# Patient Record
Sex: Male | Born: 1945 | Race: White | Hispanic: No | Marital: Married | Smoking: Never smoker
Health system: Southern US, Community
[De-identification: ages and names within clinical notes are randomized; demographics above are authoritative.]

## PROBLEM LIST (undated history)

## (undated) DIAGNOSIS — Q600 Renal agenesis, unilateral: Secondary | ICD-10-CM

## (undated) DIAGNOSIS — E785 Hyperlipidemia, unspecified: Secondary | ICD-10-CM

## (undated) DIAGNOSIS — J45909 Unspecified asthma, uncomplicated: Secondary | ICD-10-CM

## (undated) DIAGNOSIS — K140 Glossitis: Secondary | ICD-10-CM

## (undated) DIAGNOSIS — N433 Hydrocele, unspecified: Secondary | ICD-10-CM

## (undated) DIAGNOSIS — N189 Chronic kidney disease, unspecified: Secondary | ICD-10-CM

## (undated) DIAGNOSIS — M1990 Unspecified osteoarthritis, unspecified site: Secondary | ICD-10-CM

## (undated) DIAGNOSIS — M109 Gout, unspecified: Secondary | ICD-10-CM

## (undated) DIAGNOSIS — N289 Disorder of kidney and ureter, unspecified: Secondary | ICD-10-CM

## (undated) HISTORY — PX: OTHER SURGICAL HISTORY: SHX169

## (undated) HISTORY — PX: CHOLECYSTECTOMY: SHX55

---

## 2005-02-23 ENCOUNTER — Emergency Department: Payer: Self-pay | Admitting: Emergency Medicine

## 2012-09-22 DIAGNOSIS — N401 Enlarged prostate with lower urinary tract symptoms: Secondary | ICD-10-CM | POA: Insufficient documentation

## 2012-09-22 DIAGNOSIS — N430 Encysted hydrocele: Secondary | ICD-10-CM | POA: Insufficient documentation

## 2012-09-22 DIAGNOSIS — N400 Enlarged prostate without lower urinary tract symptoms: Secondary | ICD-10-CM | POA: Insufficient documentation

## 2013-07-05 ENCOUNTER — Ambulatory Visit: Payer: Self-pay | Admitting: Otolaryngology

## 2013-11-05 DIAGNOSIS — M109 Gout, unspecified: Secondary | ICD-10-CM | POA: Insufficient documentation

## 2013-11-20 DIAGNOSIS — E785 Hyperlipidemia, unspecified: Secondary | ICD-10-CM | POA: Insufficient documentation

## 2013-11-20 DIAGNOSIS — N1831 Chronic kidney disease, stage 3a: Secondary | ICD-10-CM | POA: Insufficient documentation

## 2013-11-20 DIAGNOSIS — Q600 Renal agenesis, unilateral: Secondary | ICD-10-CM | POA: Insufficient documentation

## 2013-11-20 DIAGNOSIS — S3022XA Contusion of scrotum and testes, initial encounter: Secondary | ICD-10-CM | POA: Insufficient documentation

## 2013-11-20 DIAGNOSIS — N50819 Testicular pain, unspecified: Secondary | ICD-10-CM | POA: Insufficient documentation

## 2013-11-20 DIAGNOSIS — N433 Hydrocele, unspecified: Secondary | ICD-10-CM | POA: Insufficient documentation

## 2013-11-20 DIAGNOSIS — K140 Glossitis: Secondary | ICD-10-CM | POA: Insufficient documentation

## 2015-03-03 ENCOUNTER — Other Ambulatory Visit: Payer: Self-pay | Admitting: Family Medicine

## 2015-03-03 DIAGNOSIS — R911 Solitary pulmonary nodule: Secondary | ICD-10-CM

## 2015-03-07 ENCOUNTER — Ambulatory Visit
Admission: RE | Admit: 2015-03-07 | Discharge: 2015-03-07 | Disposition: A | Discharge status: Home / Self Care | Payer: BLUE CROSS/BLUE SHIELD | Source: Ambulatory Visit | Attending: Family Medicine | Admitting: Family Medicine

## 2015-03-07 DIAGNOSIS — R911 Solitary pulmonary nodule: Secondary | ICD-10-CM | POA: Diagnosis present

## 2017-04-28 IMAGING — CT CT CHEST W/O CM
2 of 3 series · 15 of 36 positions shown, 18 images · non-contrast
Comparison: Chest x-ray ([REDACTED]) 07/01/2014, 02/26/2015

CLINICAL DATA: 69-year-old male with a history of bronchitis.
History of pulmonary nodules.

EXAM:
CT CHEST WITHOUT CONTRAST
TECHNIQUE: Multidetector CT imaging of the chest was performed following the
standard protocol without IV contrast.

[Series 2: soft tissue · axial · 0.74mm/px · z∈[-491,-181]mm · 12 of 74 slices shown, 15 images]
[im 6/74  mediastinal]
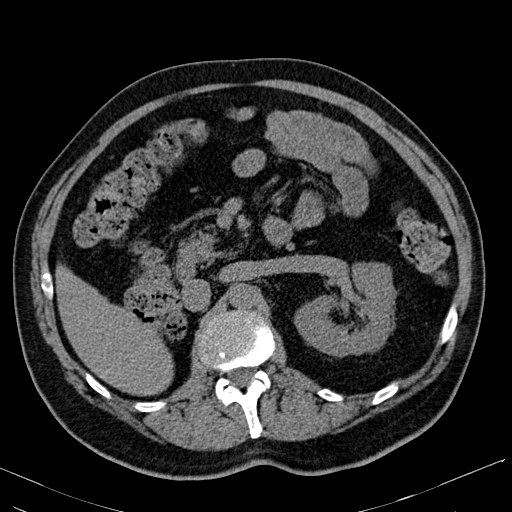
[im 6/74  lung]
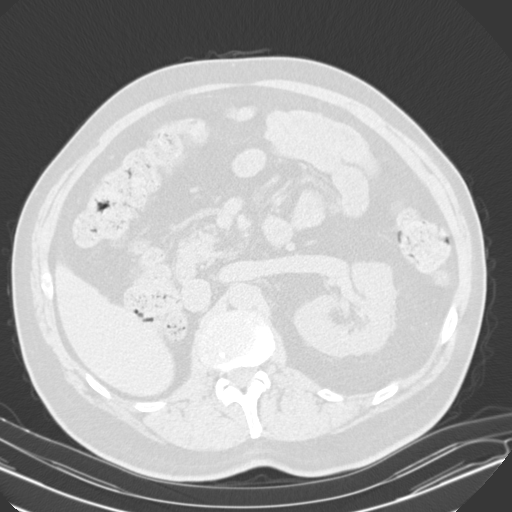
[im 11/74  lung]
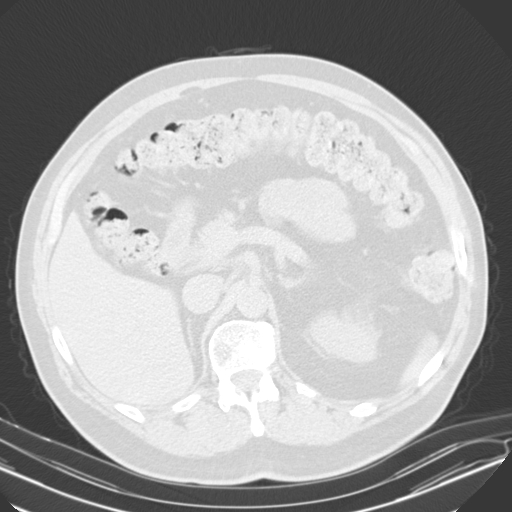
[im 17/74  lung]
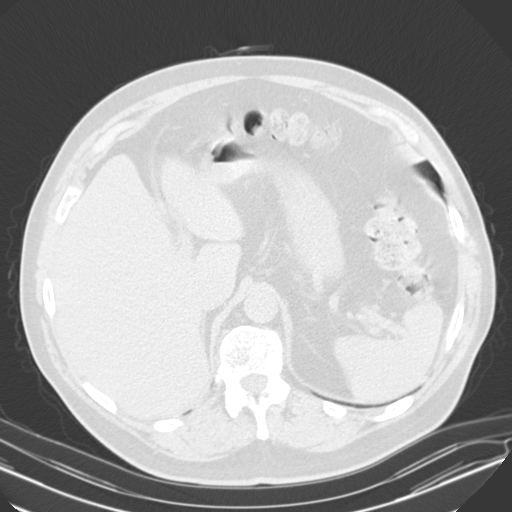
[im 22/74  lung]
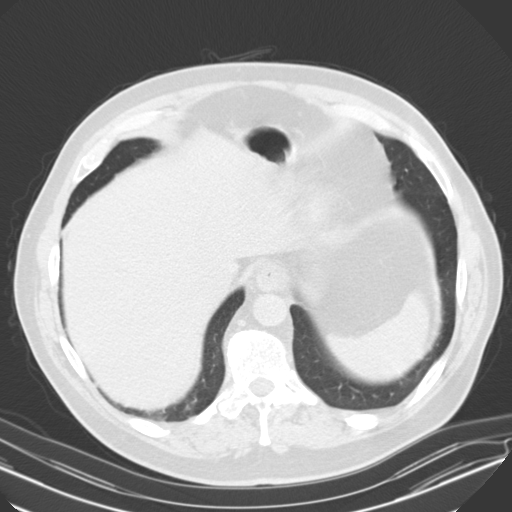
[im 28/74  mediastinal]
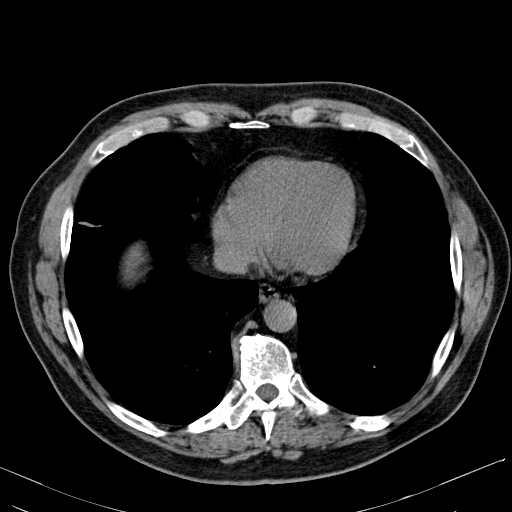
[im 28/74  lung]
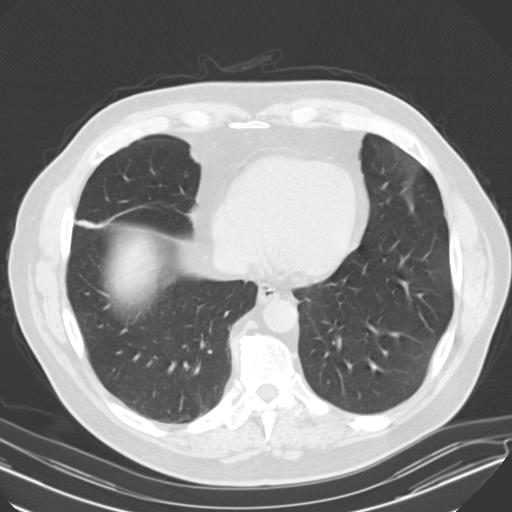
[im 33/74  lung]
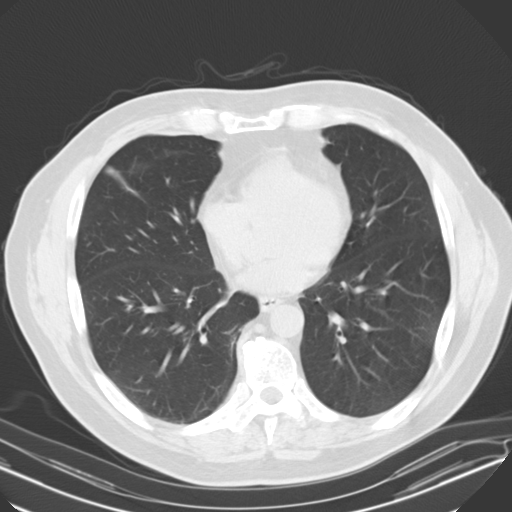
[im 41/74  lung]
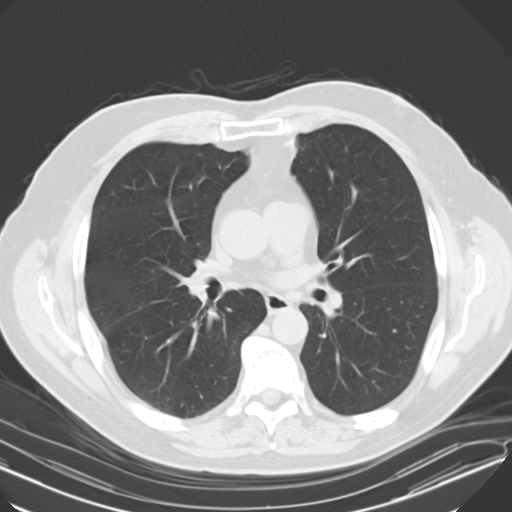
[im 46/74  lung]
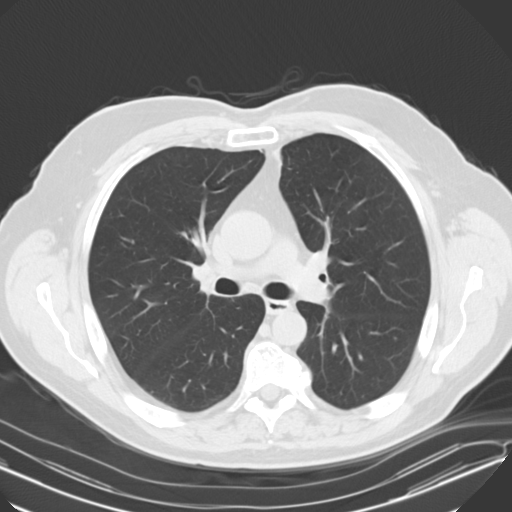
[im 52/74  mediastinal]
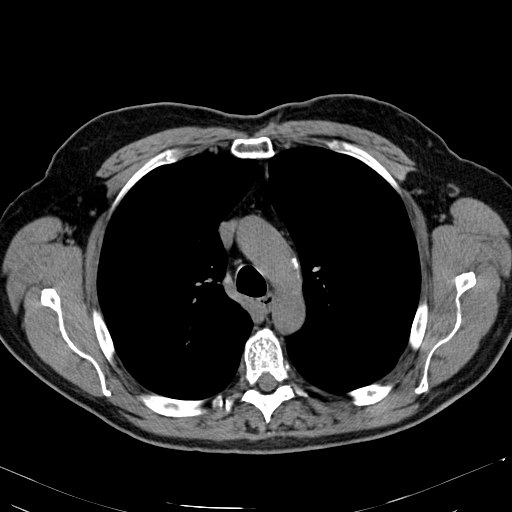
[im 52/74  lung]
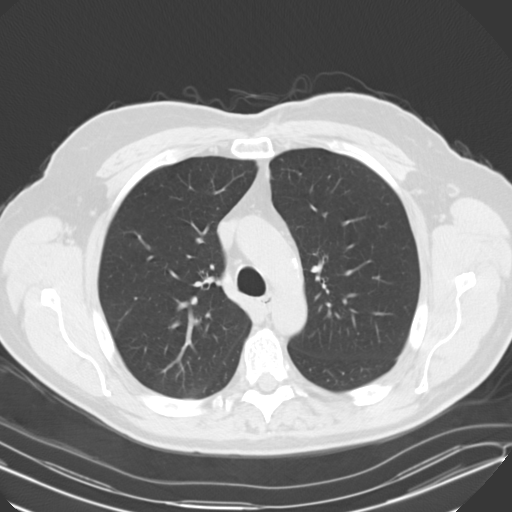
[im 57/74  lung]
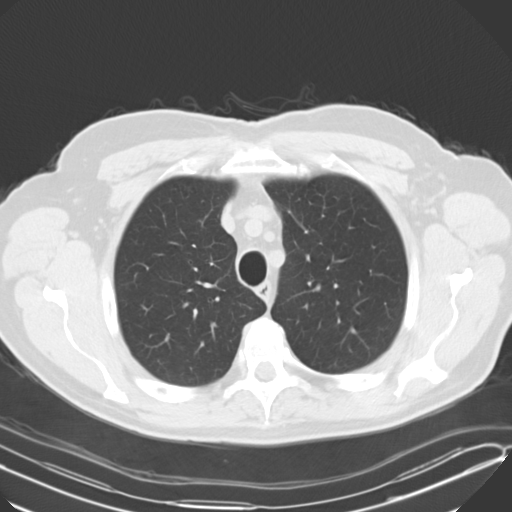
[im 63/74  lung]
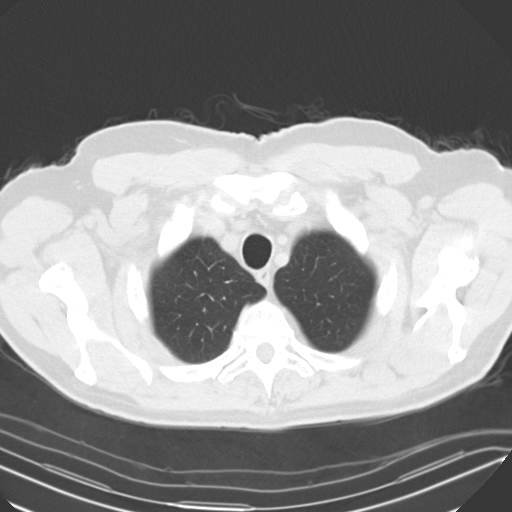
[im 68/74  lung]
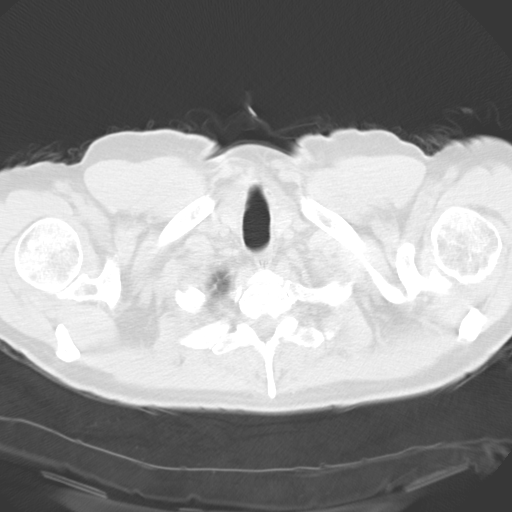

[Series 602: coronal · coronal · 0.74mm/px · 3 of 101 slices shown]
[im 21/101  lung]
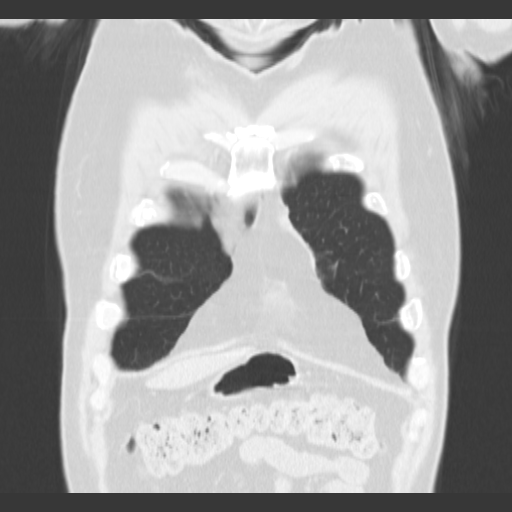
[im 41/101  lung]
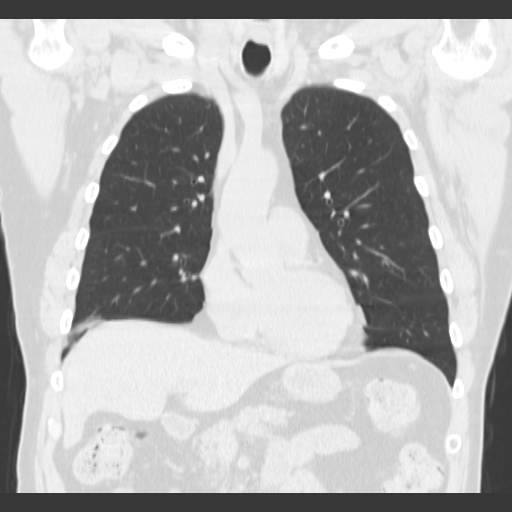
[im 61/101  lung]
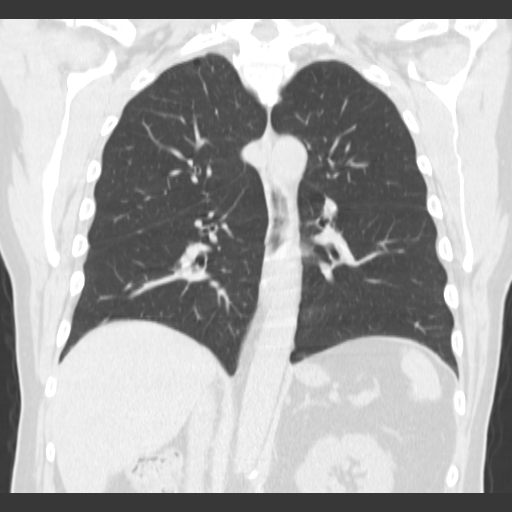

[15 of 36 positions shown; findings below may reference images not displayed]

FINDINGS: Chest:

Unremarkable appearance of the chest superficial soft tissues.

No axillary or supraclavicular adenopathy.

Unremarkable appearance of the thoracic inlet, including the
visualized thyroid.

Several mediastinal lymph nodes, none of which are enlarged by CT
size criteria or have suspicious features.

Unremarkable appearance of the esophagus.

Scattered calcifications of the thoracic aorta. No aneurysm. No
periaortic fluid.

Heart size within normal limits without pericardial fluid/
thickening.

No confluent airspace disease.

No pneumothorax or pleural effusion.

Linear opacity associated with the lingula corresponds to findings
on prior chest x-ray studies.

4 mm nodule of the right lower lobe, lateral segment.

Upper abdomen:

Surgical changes of cholecystectomy.

Musculoskeletal:

No displaced fracture identified.

Multilevel degenerative changes of the thoracic spine with anterior
osteophyte formation and disc space narrowing. No bony canal
narrowing.
IMPRESSION: No acute findings.

Findings on prior chest x-ray correspond to scarring of the lingula.

There is a 4 mm nodule of the right lower lobe. If the patient is at
high risk for bronchogenic carcinoma, follow-up chest CT at 1 year
is recommended. If the patient is at low risk, no follow-up is
needed. This recommendation follows the consensus statement:
Guidelines for Management of Small Pulmonary Nodules Detected on CT
Scans: A Statement from the [HOSPITAL] as published in

## 2018-01-22 DIAGNOSIS — J4530 Mild persistent asthma, uncomplicated: Secondary | ICD-10-CM | POA: Insufficient documentation

## 2018-01-23 DIAGNOSIS — R809 Proteinuria, unspecified: Secondary | ICD-10-CM | POA: Insufficient documentation

## 2018-01-23 DIAGNOSIS — E559 Vitamin D deficiency, unspecified: Secondary | ICD-10-CM | POA: Insufficient documentation

## 2018-04-28 ENCOUNTER — Encounter: Payer: Self-pay | Admitting: *Deleted

## 2018-05-01 ENCOUNTER — Ambulatory Visit
Admission: RE | Admit: 2018-05-01 | Discharge: 2018-05-01 | Disposition: A | Discharge status: Home / Self Care | Payer: BLUE CROSS/BLUE SHIELD | Attending: Gastroenterology | Admitting: Gastroenterology

## 2018-05-01 ENCOUNTER — Ambulatory Visit: Payer: BLUE CROSS/BLUE SHIELD | Admitting: Certified Registered"

## 2018-05-01 ENCOUNTER — Other Ambulatory Visit: Payer: Self-pay

## 2018-05-01 ENCOUNTER — Encounter
Admission: RE | Disposition: A | Discharge status: Home / Self Care | Payer: Self-pay | Source: Home / Self Care | Attending: Gastroenterology

## 2018-05-01 ENCOUNTER — Encounter: Payer: Self-pay | Admitting: Student

## 2018-05-01 DIAGNOSIS — Q600 Renal agenesis, unilateral: Secondary | ICD-10-CM | POA: Diagnosis not present

## 2018-05-01 DIAGNOSIS — D122 Benign neoplasm of ascending colon: Secondary | ICD-10-CM | POA: Diagnosis not present

## 2018-05-01 DIAGNOSIS — Z7951 Long term (current) use of inhaled steroids: Secondary | ICD-10-CM | POA: Diagnosis not present

## 2018-05-01 DIAGNOSIS — M1990 Unspecified osteoarthritis, unspecified site: Secondary | ICD-10-CM | POA: Insufficient documentation

## 2018-05-01 DIAGNOSIS — N189 Chronic kidney disease, unspecified: Secondary | ICD-10-CM | POA: Diagnosis not present

## 2018-05-01 DIAGNOSIS — D126 Benign neoplasm of colon, unspecified: Secondary | ICD-10-CM | POA: Insufficient documentation

## 2018-05-01 DIAGNOSIS — J45909 Unspecified asthma, uncomplicated: Secondary | ICD-10-CM | POA: Insufficient documentation

## 2018-05-01 DIAGNOSIS — Z9049 Acquired absence of other specified parts of digestive tract: Secondary | ICD-10-CM | POA: Insufficient documentation

## 2018-05-01 DIAGNOSIS — Z860101 Personal history of adenomatous and serrated colon polyps: Secondary | ICD-10-CM | POA: Insufficient documentation

## 2018-05-01 DIAGNOSIS — Z79899 Other long term (current) drug therapy: Secondary | ICD-10-CM | POA: Diagnosis not present

## 2018-05-01 DIAGNOSIS — K621 Rectal polyp: Secondary | ICD-10-CM | POA: Insufficient documentation

## 2018-05-01 DIAGNOSIS — E785 Hyperlipidemia, unspecified: Secondary | ICD-10-CM | POA: Insufficient documentation

## 2018-05-01 DIAGNOSIS — D123 Benign neoplasm of transverse colon: Secondary | ICD-10-CM | POA: Diagnosis not present

## 2018-05-01 DIAGNOSIS — M109 Gout, unspecified: Secondary | ICD-10-CM | POA: Diagnosis not present

## 2018-05-01 DIAGNOSIS — Z1211 Encounter for screening for malignant neoplasm of colon: Secondary | ICD-10-CM | POA: Insufficient documentation

## 2018-05-01 DIAGNOSIS — K5730 Diverticulosis of large intestine without perforation or abscess without bleeding: Secondary | ICD-10-CM | POA: Diagnosis not present

## 2018-05-01 HISTORY — DX: Hydrocele, unspecified: N43.3

## 2018-05-01 HISTORY — PX: COLONOSCOPY WITH PROPOFOL: SHX5780

## 2018-05-01 HISTORY — DX: Unspecified asthma, uncomplicated: J45.909

## 2018-05-01 HISTORY — DX: Renal agenesis, unilateral: Q60.0

## 2018-05-01 HISTORY — DX: Unspecified osteoarthritis, unspecified site: M19.90

## 2018-05-01 HISTORY — DX: Disorder of kidney and ureter, unspecified: N28.9

## 2018-05-01 HISTORY — DX: Hyperlipidemia, unspecified: E78.5

## 2018-05-01 HISTORY — DX: Gout, unspecified: M10.9

## 2018-05-01 HISTORY — DX: Glossitis: K14.0

## 2018-05-01 HISTORY — DX: Chronic kidney disease, unspecified: N18.9

## 2018-05-01 SURGERY — COLONOSCOPY WITH PROPOFOL
Anesthesia: General

## 2018-05-01 MED ORDER — PHENYLEPHRINE HCL 10 MG/ML IJ SOLN
INTRAMUSCULAR | Status: DC | PRN
  Administered 2018-05-01: 100 ug via INTRAVENOUS
  Administered 2018-05-01: 150 ug via INTRAVENOUS
  Administered 2018-05-01 (×3): 100 ug via INTRAVENOUS

## 2018-05-01 MED ORDER — LIDOCAINE HCL (PF) 2 % IJ SOLN
INTRAMUSCULAR | Status: AC
  Filled 2018-05-01: qty 10

## 2018-05-01 MED ORDER — SODIUM CHLORIDE 0.9 % IV SOLN
INTRAVENOUS | Status: DC
  Administered 2018-05-01: 14:00:00 via INTRAVENOUS

## 2018-05-01 MED ORDER — PROPOFOL 10 MG/ML IV BOLUS
INTRAVENOUS | Status: DC | PRN
  Administered 2018-05-01: 20 mg via INTRAVENOUS
  Administered 2018-05-01: 60 mg via INTRAVENOUS
  Administered 2018-05-01: 20 mg via INTRAVENOUS

## 2018-05-01 MED ORDER — PROPOFOL 500 MG/50ML IV EMUL
INTRAVENOUS | Status: AC
  Filled 2018-05-01: qty 50

## 2018-05-01 MED ORDER — GLYCOPYRROLATE 0.2 MG/ML IJ SOLN
INTRAMUSCULAR | Status: AC
  Filled 2018-05-01: qty 1

## 2018-05-01 MED ORDER — LIDOCAINE HCL (CARDIAC) PF 100 MG/5ML IV SOSY
PREFILLED_SYRINGE | INTRAVENOUS | Status: DC | PRN
  Administered 2018-05-01: 60 mg via INTRATRACHEAL

## 2018-05-01 MED ORDER — PROPOFOL 500 MG/50ML IV EMUL
INTRAVENOUS | Status: DC | PRN
  Administered 2018-05-01: 100 ug/kg/min via INTRAVENOUS

## 2018-05-01 MED ORDER — GLYCOPYRROLATE 0.2 MG/ML IJ SOLN
INTRAMUSCULAR | Status: DC | PRN
  Administered 2018-05-01 (×2): 0.1 mg via INTRAVENOUS

## 2018-05-01 NOTE — Anesthesia Post-op Follow-up Note (Signed)
Anesthesia QCDR form completed.        

## 2018-05-01 NOTE — H&P (Signed)
Outpatient short stay form Pre-procedure 05/01/2018 2:17 PM Lollie Sails MD  Primary Physician: Dr. Genene Churn  Reason for visit: Colonoscopy  History of present illness: Patient is a 73 year old male presenting today for a colonoscopy.  This is his first colonoscopy.  Colon cancer screening purposes.  He tolerated his prep well.  He takes no aspirin or blood thinning agent.  No problems with rectal bleeding abdominal pain or diarrhea.    Current Facility-Administered Medications:  .  0.9 %  sodium chloride infusion, , Intravenous, Continuous, Lollie Sails, MD, Last Rate: 20 mL/hr at 05/01/18 1353  Medications Prior to Admission  Medication Sig Dispense Refill Last Dose  . albuterol (PROVENTIL HFA;VENTOLIN HFA) 108 (90 Base) MCG/ACT inhaler Inhale 2 puffs into the lungs every 4 (four) hours as needed for wheezing or shortness of breath.   Past Month at Unknown time  . fluticasone (FLONASE) 50 MCG/ACT nasal spray Place 2 sprays into both nostrils daily.     . Fluticasone-Salmeterol (ADVAIR) 100-50 MCG/DOSE AEPB Inhale 1 puff into the lungs 2 (two) times daily. Rinse mouth afterward   Past Week at Unknown time  . diphenhydramine-acetaminophen (TYLENOL PM) 25-500 MG TABS tablet Take 2 tablets by mouth at bedtime as needed.   Not Taking at Unknown time  . loratadine (CLARITIN) 10 MG tablet Take 10 mg by mouth daily.   Not Taking at Unknown time     No Known Allergies   Past Medical History:  Diagnosis Date  . Arthritis   . Asthma    asthmatic bronchitis  . Chronic renal insufficiency   . Congenital single kidney   . Glossitis   . Gout   . Hydrocele   . Hyperlipidemia     Review of systems:      Physical Exam    Heart and lungs: Regular rate and rhythm without rub or gallop, lungs are bilaterally clear.    HEENT: Normocephalic atraumatic eyes are anicteric    Other:    Pertinant exam for procedure: Soft nontender nondistended bowel sounds positive  normoactive    Planned proceedures: Colonoscopy and indicated procedures. I have discussed the risks benefits and complications of procedures to include not limited to bleeding, infection, perforation and the risk of sedation and the patient wishes to proceed.    Lollie Sails, MD Gastroenterology 05/01/2018  2:17 PM

## 2018-05-01 NOTE — Op Note (Addendum)
[facility] Gastroenterology Patient Name: Kyle Lawrence Procedure Date: 05/01/2018 2:08 PM MRN: [id number] Account #: [id number] Date of Birth: 06/21/1945 Admit Type: Outpatient Age: 73 Room: [facility] ENDO ROOM 1 Gender: Male Note Status: Finalized Procedure:            Colonoscopy Indications:          Screening for colorectal malignant neoplasm, This is                        the patient's first colonoscopy Providers:            Lollie Sails, MD Medicines:            Monitored Anesthesia Care Complications:        No immediate complications. Procedure:            Pre-Anesthesia Assessment:                       - ASA Grade Assessment: II - A patient with mild                        systemic disease.                       After obtaining informed consent, the colonoscope was                        passed under direct vision. Throughout the procedure,                        the patient's blood pressure, pulse, and oxygen                        saturations were monitored continuously. The                        Colonoscope was introduced through the anus and                        advanced to the the cecum, identified by appendiceal                        orifice and ileocecal valve. The colonoscopy was                        performed with moderate difficulty. Successful                        completion of the procedure was aided by changing the                        patient to a supine position and changing the patient                        to a prone position. The patient tolerated the                        procedure well. The quality of the bowel preparation                        was fair. Findings:  A 2 mm polyp was found in the transverse colon. The polyp was sessile.       The polyp was removed with a cold biopsy forceps. Resection and       retrieval were complete.      A 4 mm polyp was found in the proximal ascending colon. The polyp was    sessile. The polyp was removed with a piecemeal technique using a cold       biopsy forceps. Resection and retrieval were complete.      Three sessile polyps were found in the rectum. The polyps were less than       1 mm in size. These polyps were removed with a cold biopsy forceps.       Resection and retrieval were complete.      Multiple small-mouthed diverticula were found in the sigmoid colon,       descending colon and transverse colon.      The digital rectal exam was normal. Impression:           - Preparation of the colon was fair.                       - One 2 mm polyp in the transverse colon, removed with                        a cold biopsy forceps. Resected and retrieved.                       - One 4 mm polyp in the proximal ascending colon,                        removed piecemeal using a cold biopsy forceps. Resected                        and retrieved.                       - Three less than 1 mm polyps in the rectum, removed                        with a cold biopsy forceps. Resected and retrieved.                       - Diverticulosis in the sigmoid colon, in the                        descending colon and in the transverse colon. Recommendation:       - Discharge patient to home.                       - Telephone GI clinic for pathology results in 1 week. Procedure Code(s):    --- Professional ---                       [phone], Colonoscopy, flexible; with biopsy, single or                        multiple Diagnosis Code(s):    --- Professional ---                       Z12.11,  Encounter for screening for malignant neoplasm                        of colon                       D12.3, Benign neoplasm of transverse colon (hepatic                        flexure or splenic flexure)                       D12.2, Benign neoplasm of ascending colon                       K62.1, Rectal polyp                       K57.30, Diverticulosis of large intestine without                         perforation or abscess without bleeding CPT copyright 2018 American Medical Association. All rights reserved. The codes documented in this report are preliminary and upon coder review may  be revised to meet current compliance requirements. Lollie Sails, MD 05/01/2018 2:59:55 PM This report has been signed electronically. Number of Addenda: 0 Note Initiated On: 05/01/2018 2:08 PM Total Procedure Duration: 0 hours 27 minutes 3 seconds       [facility]

## 2018-05-01 NOTE — Anesthesia Preprocedure Evaluation (Signed)
Anesthesia Evaluation  Patient identified by MRN, date of birth, ID band Patient awake    Reviewed: Allergy & Precautions, H&P , NPO status , Patient's Chart, lab work & pertinent test results  History of Anesthesia Complications Negative for: history of anesthetic complications  Airway Mallampati: III  TM Distance: <3 FB Neck ROM: full    Dental  (+) Chipped, Poor Dentition, Missing   Pulmonary asthma ,           Cardiovascular Exercise Tolerance: Good (-) angina(-) Past MI and (-) DOE negative cardio ROS       Neuro/Psych negative neurological ROS  negative psych ROS   GI/Hepatic negative GI ROS, Neg liver ROS,   Endo/Other  negative endocrine ROS  Renal/GU Renal disease  negative genitourinary   Musculoskeletal  (+) Arthritis ,   Abdominal   Peds  Hematology negative hematology ROS (+)   Anesthesia Other Findings Past Medical History: No date: Arthritis No date: Asthma     Comment:  asthmatic bronchitis No date: Chronic renal insufficiency No date: Congenital single kidney No date: Glossitis No date: Gout No date: Hydrocele No date: Hyperlipidemia  Past Surgical History: No date: CHOLECYSTECTOMY No date: deviated septum repair  BMI    Body Mass Index:  28.41 kg/m      Reproductive/Obstetrics negative OB ROS                             Anesthesia Physical Anesthesia Plan  ASA: III  Anesthesia Plan: General   Post-op Pain Management:    Induction: Intravenous  PONV Risk Score and Plan: Propofol infusion and TIVA  Airway Management Planned: Natural Airway and Nasal Cannula  Additional Equipment:   Intra-op Plan:   Post-operative Plan:   Informed Consent: I have reviewed the patients History and Physical, chart, labs and discussed the procedure including the risks, benefits and alternatives for the proposed anesthesia with the patient or authorized  representative who has indicated his/her understanding and acceptance.     Dental Advisory Given  Plan Discussed with: Anesthesiologist, CRNA and Surgeon  Anesthesia Plan Comments: (Patient consented for risks of anesthesia including but not limited to:  - adverse reactions to medications - risk of intubation if required - damage to teeth, lips or other oral mucosa - sore throat or hoarseness - Damage to heart, brain, lungs or loss of life  Patient voiced understanding.)        Anesthesia Quick Evaluation

## 2018-05-01 NOTE — Transfer of Care (Signed)
Immediate Anesthesia Transfer of Care Note  Patient: Kyle Lawrence  Procedure(s) Performed: COLONOSCOPY WITH PROPOFOL (N/A )  Patient Location: Endoscopy Unit  Anesthesia Type:General  Level of Consciousness: drowsy  Airway & Oxygen Therapy: Patient Spontanous Breathing and Patient connected to nasal cannula oxygen  Post-op Assessment: Report given to RN and Post -op Vital signs reviewed and stable  Post vital signs: stable  Last Vitals:  Vitals Value Taken Time  BP 114/69 05/01/2018  3:04 PM  Temp 36.1 C 05/01/2018  3:04 PM  Pulse 67 05/01/2018  3:06 PM  Resp 10 05/01/2018  3:06 PM  SpO2 98 % 05/01/2018  3:06 PM  Vitals shown include unvalidated device data.  Last Pain:  Vitals:   05/01/18 1504  TempSrc: Tympanic  PainSc: 0-No pain         Complications: No apparent anesthesia complications

## 2018-05-02 ENCOUNTER — Encounter: Payer: Self-pay | Admitting: Gastroenterology

## 2018-05-03 LAB — SURGICAL PATHOLOGY

## 2018-05-03 NOTE — Anesthesia Postprocedure Evaluation (Signed)
Anesthesia Post Note  Patient: Kyle Lawrence  Procedure(s) Performed: COLONOSCOPY WITH PROPOFOL (N/A )  Patient location during evaluation: Endoscopy Anesthesia Type: General Level of consciousness: awake and alert Pain management: pain level controlled Vital Signs Assessment: post-procedure vital signs reviewed and stable Respiratory status: spontaneous breathing, nonlabored ventilation and respiratory function stable Cardiovascular status: blood pressure returned to baseline and stable Postop Assessment: no apparent nausea or vomiting Anesthetic complications: no     Last Vitals:  Vitals:   05/01/18 1524 05/01/18 1534  BP: 120/86 129/88  Pulse: 67 68  Resp: 15 16  Temp:    SpO2: 96% 97%    Last Pain:  Vitals:   05/01/18 1534  TempSrc:   PainSc: 0-No pain                 Alphonsus Sias

## 2022-07-26 NOTE — Telephone Encounter (Signed)
Please review.  KP

## 2022-07-27 NOTE — Telephone Encounter (Signed)
Pt response.  KP

## 2022-07-29 ENCOUNTER — Encounter: Payer: Self-pay | Admitting: Physician Assistant

## 2022-08-03 ENCOUNTER — Ambulatory Visit (INDEPENDENT_AMBULATORY_CARE_PROVIDER_SITE_OTHER): Payer: Medicare Other | Admitting: Physician Assistant

## 2022-08-03 ENCOUNTER — Encounter: Payer: Self-pay | Admitting: Physician Assistant

## 2022-08-03 VITALS — BP 120/78 | HR 76 | Temp 98.1°F | Ht 70.0 in | Wt 204.0 lb

## 2022-08-03 DIAGNOSIS — E559 Vitamin D deficiency, unspecified: Secondary | ICD-10-CM

## 2022-08-03 DIAGNOSIS — N401 Enlarged prostate with lower urinary tract symptoms: Secondary | ICD-10-CM | POA: Diagnosis not present

## 2022-08-03 DIAGNOSIS — N1831 Chronic kidney disease, stage 3a: Secondary | ICD-10-CM | POA: Diagnosis not present

## 2022-08-03 DIAGNOSIS — E790 Hyperuricemia without signs of inflammatory arthritis and tophaceous disease: Secondary | ICD-10-CM | POA: Diagnosis not present

## 2022-08-03 DIAGNOSIS — Q600 Renal agenesis, unilateral: Secondary | ICD-10-CM | POA: Diagnosis not present

## 2022-08-03 DIAGNOSIS — R300 Dysuria: Secondary | ICD-10-CM

## 2022-08-03 DIAGNOSIS — E7800 Pure hypercholesterolemia, unspecified: Secondary | ICD-10-CM

## 2022-08-03 LAB — POCT URINALYSIS DIPSTICK
Bilirubin, UA: NEGATIVE
Blood, UA: NEGATIVE
Glucose, UA: NEGATIVE
Ketones, UA: NEGATIVE
Leukocytes, UA: NEGATIVE
Nitrite, UA: NEGATIVE
Protein, UA: NEGATIVE
Spec Grav, UA: 1.015 (ref 1.010–1.025)
Urobilinogen, UA: 0.2 E.U./dL
pH, UA: 6 (ref 5.0–8.0)

## 2022-08-03 NOTE — Patient Instructions (Addendum)

## 2022-08-03 NOTE — Progress Notes (Signed)
Date:  08/03/2022   Name:  Kyle Lawrence   DOB:  Jul 20, 1945   MRN:  [id number]   Chief Complaint: New Patient (Initial Visit) and Flank Pain (Pain and pressure on left flank. Patient only has one kidney. Burning while urinating. Constant pressure. This all started about a month ago and he seen blood in it. But no blood present now. )  HPI Kyle Lawrence is a very pleasant 77 year old male with a history of horseshoe kidney, CKD G3aA2, BPH with LUTS, and gout presents new to our clinic today to establish care and discuss urinary concern describing dysuria with associated left flank discomfort which is mild but persistent, described as "pressure, slight tenderness".  Initially he saw blood in the urine but this resolved spontaneously without recurrence.  No penile discharge.  Married, wife's name is Misty Stanley. Otherwise feels well.   He has been lost to routine care for 3+ years, he was unexpectedly discharged from [facility] after missing a few appointments and simply did not find a new PCP until now.  He does not see a nephrologist or urologist.   Medication list has been reviewed and updated.  Current Meds  Medication Sig   albuterol (PROVENTIL HFA;VENTOLIN HFA) 108 (90 Base) MCG/ACT inhaler Inhale 2 puffs into the lungs every 4 (four) hours as needed for wheezing or shortness of breath.   calcium-vitamin D (OSCAL WITH D) 500-5 MG-MCG tablet Take 1 tablet by mouth.   [DISCONTINUED] loratadine (CLARITIN) 10 MG tablet Take 10 mg by mouth daily.     Review of Systems  Constitutional:  Negative for fatigue and fever.  Respiratory:  Negative for chest tightness and shortness of breath.   Cardiovascular:  Negative for chest pain and palpitations.  Gastrointestinal:  Negative for abdominal pain.  Genitourinary:  Positive for dysuria, flank pain and hematuria. Negative for frequency, penile discharge, penile swelling, testicular pain and urgency.    Patient Active Problem List    Diagnosis Date Noted   Tubular adenoma of colon 05/01/2018   Vitamin D deficiency 01/23/2018   Asymptomatic proteinuria 01/23/2018   Asthmatic bronchitis, mild persistent, uncomplicated 01/22/2018   Scrotal hematoma 11/20/2013   Renal agenesis, unilateral 11/20/2013   Orchalgia 11/20/2013   Hyperlipidemia 11/20/2013   Hydrocele 11/20/2013   Glossitis 11/20/2013   Chronic kidney disease (CKD) stage G3a/A2, moderately decreased glomerular filtration rate (GFR) between 45-59 mL/min/1.73 square meter and albuminuria creatinine ratio between 30-299 mg/g (HCC) 11/20/2013   Gout 11/05/2013   Encysted hydrocele 09/22/2012   Benign prostatic hyperplasia without lower urinary tract symptoms 09/22/2012   Benign prostatic hyperplasia with lower urinary tract symptoms 09/22/2012    Allergies  Allergen Reactions   Colchicine Diarrhea and Nausea Only    Immunization History  Administered Date(s) Administered   Influenza Inj Mdck Quad Pf 01/23/2018   Pneumococcal Polysaccharide-23 10/17/2018    Past Surgical History:  Procedure Laterality Date   CHOLECYSTECTOMY     COLONOSCOPY WITH PROPOFOL N/A 05/01/2018   Procedure: COLONOSCOPY WITH PROPOFOL;  Surgeon: Christena Deem, MD;  Location: [facility] ENDOSCOPY;  Service: Endoscopy;  Laterality: N/A;   deviated septum repair      Social History   Tobacco Use   Smoking status: Never   Smokeless tobacco: Never  Vaping Use   Vaping Use: Never used  Substance Use Topics   Alcohol use: Yes    Comment: occasional wine   Drug use: Never    Family History  Problem Relation Age of Onset  Diabetes Mother    Kidney disease Father    Diabetes Maternal Uncle         08/03/2022    8:52 AM  GAD 7 : Generalized Anxiety Score  Nervous, Anxious, on Edge 0  Control/stop worrying 0  Worry too much - different things 0  Trouble relaxing 0  Restless 0  Easily annoyed or irritable 0  Afraid - awful might happen 0  Total GAD 7 Score 0  Anxiety  Difficulty Not difficult at all       08/03/2022    8:52 AM  Depression screen PHQ 2/9  Decreased Interest 0  Down, Depressed, Hopeless 0  PHQ - 2 Score 0  Altered sleeping 0  Tired, decreased energy 0  Change in appetite 0  Feeling bad or failure about yourself  0  Trouble concentrating 0  Moving slowly or fidgety/restless 0  Suicidal thoughts 0  PHQ-9 Score 0  Difficult doing work/chores Not difficult at all    BP Readings from Last 3 Encounters:  08/03/22 120/78  05/01/18 129/88    Wt Readings from Last 3 Encounters:  08/03/22 204 lb (92.5 kg)  05/01/18 198 lb (89.8 kg)    BP 120/78   Pulse 76   Temp 98.1 F (36.7 C) (Oral)   Ht 5\' 10"  (1.778 m)   Wt 204 lb (92.5 kg)   SpO2 96%   BMI 29.27 kg/m   Physical Exam Vitals and nursing note reviewed.  Constitutional:      Appearance: Normal appearance.  Cardiovascular:     Rate and Rhythm: Normal rate and regular rhythm.     Heart sounds: No murmur heard.    No friction rub. No gallop.  Pulmonary:     Effort: Pulmonary effort is normal.     Breath sounds: Normal breath sounds.  Abdominal:     General: Abdomen is flat. Bowel sounds are normal. There is no distension.     Palpations: Abdomen is soft. There is no mass.     Tenderness: There is no abdominal tenderness. There is no right CVA tenderness or left CVA tenderness.     Hernia: No hernia is present.     Comments: Essentially normal abdominal exam with no obvious tenderness, perhaps a subtle discomfort of left upper quadrant/flank but this is minimal even with deep palpation.  Genitourinary:    Comments: Prostate exam deferred today as we are checking PSA, but plan to perform at complete physical in the next few weeks.  Musculoskeletal:        General: Normal range of motion.  Skin:    General: Skin is warm and dry.  Neurological:     Mental Status: He is alert and oriented to person, place, and time.     Gait: Gait is intact.  Psychiatric:         Mood and Affect: Mood and affect normal.     Recent Labs  No results found for: "NA", "K", "CL", "CO2", "GLUCOSE", "BUN", "CREATININE", "CALCIUM", "PROT", "ALBUMIN", "AST", "ALT", "ALKPHOS", "BILITOT", "GFRNONAA", "GFRAA"  No results found for: "WBC", "HGB", "HCT", "MCV", "PLT" No results found for: "HGBA1C" No results found for: "CHOL", "HDL", "LDLCALC", "LDLDIRECT", "TRIG", "CHOLHDL" No results found for: "TSH"   Assessment and Plan:  1. Dysuria Urine dipstick clear today.  At this time most likely cause for dysuria is BPH, consider tamsulosin if today's labs normal. Differential also includes urethral stenosis, nonobstructive kidney stone.  - POCT Urinalysis Dipstick  2. Benign prostatic  hyperplasia with lower urinary tract symptoms, symptom details unspecified Check PSA today.  Plan for prostate exam next visit. - PSA  3. Renal agenesis, unilateral Obtain baseline labs today - CBC with Differential/Platelet - Comprehensive metabolic panel  4. Chronic kidney disease (CKD) stage G3a/A2, moderately decreased glomerular filtration rate (GFR) between 45-59 mL/min/1.73 square meter and albuminuria creatinine ratio between 30-299 mg/g (HCC) Obtain baseline labs today along with micro albumin - CBC with Differential/Platelet - Comprehensive metabolic panel - Microalbumin / creatinine urine ratio  5. Hyperuricemia Check uric acid levels, last known uric acid was 8.8 in 2019, I discussed with patient is out of range.  Info given on best and worst foods for gout. - Uric acid     Close f/u with me in 2-4wk for fasting CPE. Check lipids and Vit D among other tests if necessary.   Partially dictated using Animal nutritionist. Any errors are unintentional.  Alvester Morin, PA-C, DMSc, Nutritionist [facility] [phone]

## 2022-08-04 ENCOUNTER — Other Ambulatory Visit: Payer: Self-pay | Admitting: Physician Assistant

## 2022-08-04 DIAGNOSIS — M1A3720 Chronic gout due to renal impairment, left ankle and foot, without tophus (tophi): Secondary | ICD-10-CM

## 2022-08-04 DIAGNOSIS — N400 Enlarged prostate without lower urinary tract symptoms: Secondary | ICD-10-CM

## 2022-08-04 LAB — CBC WITH DIFFERENTIAL/PLATELET
Basophils Absolute: 0 10*3/uL (ref 0.0–0.2)
Basos: 0 %
EOS (ABSOLUTE): 0.2 10*3/uL (ref 0.0–0.4)
Eos: 2 %
Hematocrit: 47.5 % (ref 37.5–51.0)
Hemoglobin: 15.4 g/dL (ref 13.0–17.7)
Immature Grans (Abs): 0 10*3/uL (ref 0.0–0.1)
Immature Granulocytes: 0 %
Lymphocytes Absolute: 2 10*3/uL (ref 0.7–3.1)
Lymphs: 26 %
MCH: 29.9 pg (ref 26.6–33.0)
MCHC: 32.4 g/dL (ref 31.5–35.7)
MCV: 92 fL (ref 79–97)
Monocytes Absolute: 0.8 10*3/uL (ref 0.1–0.9)
Monocytes: 10 %
Neutrophils Absolute: 4.8 10*3/uL (ref 1.4–7.0)
Neutrophils: 62 %
Platelets: 187 10*3/uL (ref 150–450)
RBC: 5.15 x10E6/uL (ref 4.14–5.80)
RDW: 12.3 % (ref 11.6–15.4)
WBC: 7.8 10*3/uL (ref 3.4–10.8)

## 2022-08-04 LAB — COMPREHENSIVE METABOLIC PANEL
ALT: 24 IU/L (ref 0–44)
AST: 21 IU/L (ref 0–40)
Albumin/Globulin Ratio: 1.7 (ref 1.2–2.2)
Albumin: 4.7 g/dL (ref 3.8–4.8)
Alkaline Phosphatase: 84 IU/L (ref 44–121)
BUN/Creatinine Ratio: 10 (ref 10–24)
BUN: 15 mg/dL (ref 8–27)
Bilirubin Total: 0.7 mg/dL (ref 0.0–1.2)
CO2: 22 mmol/L (ref 20–29)
Calcium: 10.6 mg/dL — ABNORMAL HIGH (ref 8.6–10.2)
Chloride: 103 mmol/L (ref 96–106)
Creatinine, Ser: 1.57 mg/dL — ABNORMAL HIGH (ref 0.76–1.27)
Globulin, Total: 2.7 g/dL (ref 1.5–4.5)
Glucose: 92 mg/dL (ref 70–99)
Potassium: 5.1 mmol/L (ref 3.5–5.2)
Sodium: 139 mmol/L (ref 134–144)
Total Protein: 7.4 g/dL (ref 6.0–8.5)
eGFR: 45 mL/min/{1.73_m2} — ABNORMAL LOW (ref >59)

## 2022-08-04 LAB — URIC ACID: Uric Acid: 8.2 mg/dL (ref 3.8–8.4)

## 2022-08-04 LAB — SPECIMEN STATUS REPORT

## 2022-08-04 LAB — MICROALBUMIN / CREATININE URINE RATIO
Creatinine, Urine: 50.4 mg/dL
Microalb/Creat Ratio: 159 mg/g creat — ABNORMAL HIGH (ref 0–29)
Microalbumin, Urine: 80.1 ug/mL

## 2022-08-04 LAB — PSA: Prostate Specific Ag, Serum: 0.8 ng/mL (ref 0.0–4.0)

## 2022-08-04 MED ORDER — TAMSULOSIN HCL 0.4 MG PO CAPS: 0.4000 mg | ORAL_CAPSULE | Freq: Every day | ORAL | 3 refills | Status: DC

## 2022-08-04 MED ORDER — ALLOPURINOL 100 MG PO TABS: ORAL_TABLET | ORAL | 2 refills | Status: DC

## 2022-08-11 NOTE — Telephone Encounter (Signed)
Please review.  KP

## 2022-08-13 ENCOUNTER — Ambulatory Visit: Payer: Medicare Other | Admitting: Physician Assistant

## 2022-08-18 ENCOUNTER — Ambulatory Visit (INDEPENDENT_AMBULATORY_CARE_PROVIDER_SITE_OTHER): Payer: Medicare Other

## 2022-08-18 ENCOUNTER — Ambulatory Visit (INDEPENDENT_AMBULATORY_CARE_PROVIDER_SITE_OTHER): Payer: Medicare Other | Admitting: Physician Assistant

## 2022-08-18 ENCOUNTER — Encounter: Payer: Self-pay | Admitting: Physician Assistant

## 2022-08-18 VITALS — BP 104/76 | HR 75 | Ht 70.0 in | Wt 202.0 lb

## 2022-08-18 VITALS — BP 104/76 | HR 75 | Temp 98.0°F | Ht 70.0 in | Wt 202.0 lb

## 2022-08-18 DIAGNOSIS — R052 Subacute cough: Secondary | ICD-10-CM | POA: Diagnosis not present

## 2022-08-18 DIAGNOSIS — Z0000 Encounter for general adult medical examination without abnormal findings: Secondary | ICD-10-CM

## 2022-08-18 DIAGNOSIS — N1831 Chronic kidney disease, stage 3a: Secondary | ICD-10-CM | POA: Diagnosis not present

## 2022-08-18 DIAGNOSIS — L821 Other seborrheic keratosis: Secondary | ICD-10-CM

## 2022-08-18 DIAGNOSIS — B351 Tinea unguium: Secondary | ICD-10-CM

## 2022-08-18 DIAGNOSIS — N401 Enlarged prostate with lower urinary tract symptoms: Secondary | ICD-10-CM

## 2022-08-18 LAB — POC HEMOCCULT BLD/STL (OFFICE/1-CARD/DIAGNOSTIC): Fecal Occult Blood, POC: NEGATIVE

## 2022-08-18 MED ORDER — FINASTERIDE 5 MG PO TABS
5.0000 mg | ORAL_TABLET | Freq: Every day | ORAL | 1 refills | Status: DC
Start: 2022-08-18 — End: 2022-10-25

## 2022-08-18 NOTE — Patient Instructions (Addendum)
-  It was a pleasure to see you today! Please review your visit summary for helpful information -I would encourage you to follow your care via MyChart where you can access lab results, notes, messages, and more -If you feel that we did a nice job today, please complete your after-visit survey and leave [place] a Google review! Your CMA today was Mariann Barter and your provider was Alvester Morin, PA-C, DMSc -Please return for follow-up in about 4 weeks for a fasting visit. We will check Vitamin D, cholesterol, and uric acid.

## 2022-08-18 NOTE — Progress Notes (Signed)
Subjective:   Kyle Lawrence is a 77 y.o. male who presents for Medicare Annual/Subsequent preventive examination.  Review of Systems    Pt in office        Objective:    There were no vitals filed for this visit. There is no height or weight on file to calculate BMI.     08/18/2022    8:20 AM 05/01/2018    1:27 PM  Advanced Directives  Does Patient Have a Medical Advance Directive? No No  Would patient like information on creating a medical advance directive?  No - Patient declined    Current Medications (verified) Outpatient Encounter Medications as of 08/18/2022  Medication Sig   albuterol (PROVENTIL HFA;VENTOLIN HFA) 108 (90 Base) MCG/ACT inhaler Inhale 2 puffs into the lungs every 4 (four) hours as needed for wheezing or shortness of breath.   allopurinol (ZYLOPRIM) 100 MG tablet Take 0.5 tablets (50 mg total) by mouth daily for 14 days, THEN 1 tablet (100 mg total) daily.   Ascorbic Acid (VITA-C PO) Take by mouth.   calcium-vitamin D (OSCAL WITH D) 500-5 MG-MCG tablet Take 1 tablet by mouth every other day.   tamsulosin (FLOMAX) 0.4 MG CAPS capsule Take 1 capsule (0.4 mg total) by mouth daily.   VITAMIN D PO Take by mouth.   No facility-administered encounter medications on file as of 08/18/2022.    Allergies (verified) Colchicine   History: Past Medical History:  Diagnosis Date   Arthritis    Asthma    asthmatic bronchitis   Chronic renal insufficiency    Congenital single kidney    Glossitis    Gout    Hydrocele    Hyperlipidemia    Past Surgical History:  Procedure Laterality Date   CHOLECYSTECTOMY     COLONOSCOPY WITH PROPOFOL N/A 05/01/2018   Procedure: COLONOSCOPY WITH PROPOFOL;  Surgeon: Christena Deem, MD;  Location: [facility] ENDOSCOPY;  Service: Endoscopy;  Laterality: N/A;   deviated septum repair     Family History  Problem Relation Age of Onset   Diabetes Mother    Kidney disease Father    Diabetes Maternal Uncle    Social History    Socioeconomic History   Marital status: Married    Spouse name: Not on file   Number of children: 2   Years of education: Not on file   Highest education level: Not on file  Occupational History   Not on file  Tobacco Use   Smoking status: Never   Smokeless tobacco: Never  Vaping Use   Vaping Use: Never used  Substance and Sexual Activity   Alcohol use: Yes    Comment: occasional wine   Drug use: Never   Sexual activity: Yes    Birth control/protection: None  Other Topics Concern   Not on file  Social History Narrative   Not on file   Social Determinants of Health   Financial Resource Strain: Low Risk  (08/18/2022)   Overall Financial Resource Strain (CARDIA)    Difficulty of Paying Living Expenses: Not hard at all  Food Insecurity: No Food Insecurity (08/18/2022)   Hunger Vital Sign    Worried About Running Out of Food in the Last Year: Never true    Ran Out of Food in the Last Year: Never true  Transportation Needs: No Transportation Needs (08/18/2022)   PRAPARE - Administrator, Civil Service (Medical): No    Lack of Transportation (Non-Medical): No  Physical Activity: Not on  file  Stress: Not on file  Social Connections: Not on file    Tobacco Counseling Counseling given: Not Answered   Clinical Intake:  Pre-visit preparation completed: Yes  Pain : No/denies pain     Diabetes: No     Diabetic?no  Interpreter Needed?: No      Activities of Daily Living    08/18/2022    8:20 AM  In your present state of health, do you have any difficulty performing the following activities:  Hearing? 1  Vision? 0  Difficulty concentrating or making decisions? 0  Walking or climbing stairs? 0  Dressing or bathing? 0  Doing errands, shopping? 0  Preparing Food and eating ? N  Using the Toilet? N  In the past six months, have you accidently leaked urine? N  Do you have problems with loss of bowel control? N  Managing your Medications? N  Managing  your Finances? N    Patient Care Team: Remo Lipps, PA as PCP - General (Physician Assistant)  Indicate any recent Medical Services you may have received from other than [facility] providers in the past year (date may be approximate).     Assessment:   This is a routine wellness examination for [name].  Hearing/Vision screen Hearing Screening - Comments:: Yes   Dietary issues and exercise activities discussed: Current Exercise Habits: Home exercise routine, Type of exercise: walking;Other - see comments (yard work), Frequency (Times/Week): 3, Intensity: Mild   Goals Addressed   None   Depression Screen    08/18/2022    8:09 AM 08/03/2022    8:52 AM  PHQ 2/9 Scores  PHQ - 2 Score 0 0  PHQ- 9 Score 0 0    Fall Risk    08/18/2022    8:10 AM 08/03/2022    8:52 AM  Fall Risk   Falls in the past year? 1 0  Number falls in past yr: 0 0  Injury with Fall? 0 0  Risk for fall due to : No Fall Risks No Fall Risks  Follow up Falls evaluation completed Falls evaluation completed    FALL RISK PREVENTION PERTAINING TO THE HOME:  Any stairs in or around the home? Yes  If so, are there any without handrails? No  Home free of loose throw rugs in walkways, pet beds, electrical cords, etc? Yes  Adequate lighting in your home to reduce risk of falls? Yes   ASSISTIVE DEVICES UTILIZED TO PREVENT FALLS:  Life alert? No  Use of a cane, walker or w/c? No  Grab bars in the bathroom? No  Shower chair or bench in shower? No  Elevated toilet seat or a handicapped toilet? No   TIMED UP AND GO:  Was the test performed? No .  Length of time to ambulate 10 feet: n/a sec.   Gait steady and fast without use of assistive device  Cognitive Function:        08/18/2022    8:21 AM  6CIT Screen  What Year? 0 points  What month? 0 points  What time? 0 points  Count back from 20 0 points  Months in reverse 0 points  Repeat phrase 2 points  Total Score 2 points     Immunizations Immunization History  Administered Date(s) Administered   Influenza Inj Mdck Quad Pf 01/23/2018   Pneumococcal Polysaccharide-23 10/17/2018    TDAP status: Due, Education has been provided regarding the importance of this vaccine. Advised may receive this vaccine at local pharmacy or  Health Dept. Aware to provide a copy of the vaccination record if obtained from local pharmacy or Health Dept. Verbalized acceptance and understanding.    Pneumococcal vaccine status: Due, Education has been provided regarding the importance of this vaccine. Advised may receive this vaccine at local pharmacy or Health Dept. Aware to provide a copy of the vaccination record if obtained from local pharmacy or Health Dept. Verbalized acceptance and understanding.  Covid-19 vaccine status: Information provided on how to obtain vaccines.   Qualifies for Shingles Vaccine? Yes   Zostavax completed No   Shingrix Completed?: No.    Education has been provided regarding the importance of this vaccine. Patient has been advised to call insurance company to determine out of pocket expense if they have not yet received this vaccine. Advised may also receive vaccine at local pharmacy or Health Dept. Verbalized acceptance and understanding.  Screening Tests Health Maintenance  Topic Date Due   DTaP/Tdap/Td (1 - Tdap) Never done   Zoster Vaccines- Shingrix (1 of 2) Never done   Pneumonia Vaccine 67+ Years old (2 of 2 - PCV) 10/17/2019   COVID-19 Vaccine (1) 09/03/2022 (Originally 11/07/1945)   INFLUENZA VACCINE  10/14/2022   Medicare Annual Wellness (AWV)  08/18/2023   Hepatitis C Screening  Completed   HPV VACCINES  Aged Out    Health Maintenance  Health Maintenance Due  Topic Date Due   DTaP/Tdap/Td (1 - Tdap) Never done   Zoster Vaccines- Shingrix (1 of 2) Never done   Pneumonia Vaccine 70+ Years old (2 of 2 - PCV) 10/17/2019    Lung Cancer Screening: (Low Dose CT Chest recommended if Age  25-80 years, 30 pack-year currently smoking OR have quit w/in 15years.) does not qualify.   Lung Cancer Screening Referral: n/a  Additional Screening:  Hepatitis C Screening: does qualify; Completed 10/17/2018  Vision Screening: Recommended annual ophthalmology exams for early detection of glaucoma and other disorders of the eye. Is the patient up to date with their annual eye exam?  Yes  Who is the provider or what is the name of the office in which the patient attends annual eye exams? Dr. Lamount Cranker If pt is not established with a provider, would they like to be referred to a provider to establish care? No .   Dental Screening: Recommended annual dental exams for proper oral hygiene  Community Resource Referral / Chronic Care Management: CRR required this visit?  No   CCM required this visit?  No      Plan:     I have personally reviewed and noted the following in the patient's chart:   Medical and social history Use of alcohol, tobacco or illicit drugs  Current medications and supplements including opioid prescriptions. Patient is not currently taking opioid prescriptions. Functional ability and status Nutritional status Physical activity Advanced directives List of other physicians Hospitalizations, surgeries, and ER visits in previous 12 months Vitals Screenings to include cognitive, depression, and falls Referrals and appointments  In addition, I have reviewed and discussed with patient certain preventive protocols, quality metrics, and best practice recommendations. A written personalized care plan for preventive services as well as general preventive health recommendations were provided to patient.     Eulis Canner Elsy Chiang, CMA   08/18/2022   Nurse Notes: none

## 2022-08-18 NOTE — Progress Notes (Signed)
Date:  08/18/2022   Name:  Kyle Lawrence   DOB:  12-05-1945   MRN:  [id number]   Chief Complaint: Annual Exam  HPI Kyle Lawrence is a 77 y.o. male who presents today for his Complete Annual Exam. He feels well. 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 He reports he is sleeping well. Last physical 2020.  Last OV we started tamsulosin for BPH and allopurinol for gout. He reports the tamsulosin helped his urinary flow and "kidney pressure", but unfortunately he has an isolated incident of presyncope/fall while making the bed - says he coughed and fell back on his buttocks, nearly blacked out but did not lose consciousness and did not sustain any major injury requiring further evaluation. Feels like the medication makes him dizzy and would like to stop. No SE with allopurinol so far.   Mentions his asthma, worse this time of year, not taking any allergy medication. Has albuterol inhaler which helps. Without it he has some chest tightness when he inhales but no chest pain.   Immunization History  Administered Date(s) Administered   Influenza Inj Mdck Quad Pf 01/23/2018   Pneumococcal Polysaccharide-23 10/17/2018   Health Maintenance Due  Topic Date Due   DTaP/Tdap/Td (1 - Tdap) Never done   Zoster Vaccines- Shingrix (1 of 2) Never done   Pneumonia Vaccine 1+ Years old (2 of 2 - PCV) 10/17/2019    Lab Results  Component Value Date   PSA1 0.8 08/03/2022      Medication list has been reviewed and updated.  Current Meds  Medication Sig   albuterol (PROVENTIL HFA;VENTOLIN HFA) 108 (90 Base) MCG/ACT inhaler Inhale 2 puffs into the lungs every 4 (four) hours as needed for wheezing or shortness of breath.   allopurinol (ZYLOPRIM) 100 MG tablet Take 0.5 tablets (50 mg total) by mouth daily for 14 days, THEN 1 tablet (100 mg total) daily.   Ascorbic Acid (VITA-C PO) Take by mouth.   finasteride (PROSCAR) 5 MG tablet Take 1 tablet (5 mg total) by mouth daily.    VITAMIN D PO Take by mouth.   [DISCONTINUED] calcium-vitamin D (OSCAL WITH D) 500-5 MG-MCG tablet Take 1 tablet by mouth every other day.   [DISCONTINUED] tamsulosin (FLOMAX) 0.4 MG CAPS capsule Take 1 capsule (0.4 mg total) by mouth daily.     Review of Systems  Constitutional:  Negative for activity change, appetite change, fatigue and unexpected weight change.  HENT:  Negative for dental problem, hearing loss and trouble swallowing.   Eyes:  Negative for visual disturbance.  Respiratory:  Negative for cough, chest tightness, shortness of breath and wheezing.   Cardiovascular:  Negative for chest pain, palpitations and leg swelling.  Gastrointestinal:  Negative for abdominal distention, abdominal pain, blood in stool, constipation and diarrhea.  Endocrine: Negative for polydipsia, polyphagia and polyuria.  Genitourinary:  Negative for difficulty urinating, dysuria, enuresis, frequency, hematuria and testicular pain.  Musculoskeletal:  Negative for arthralgias, gait problem and joint swelling.  Skin:  Negative for rash and wound.  Neurological:  Positive for dizziness (tamsulosin?). Negative for syncope, weakness and headaches.  Psychiatric/Behavioral:  Negative for behavioral problems and sleep disturbance.     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  Orchalgia 11/20/2013   Hyperlipidemia 11/20/2013   Hydrocele 11/20/2013   Glossitis 11/20/2013   Chronic kidney disease (CKD) stage G3a/A2, moderately decreased glomerular filtration rate (GFR) between 45-59 mL/min/1.73 square meter and albuminuria creatinine ratio between 30-299 mg/g (HCC) 11/20/2013   Gout 11/05/2013   Encysted hydrocele 09/22/2012   Benign prostatic  hyperplasia with lower urinary tract symptoms 09/22/2012    Allergies  Allergen Reactions   Colchicine Diarrhea and Nausea Only    Immunization History  Administered Date(s) Administered   Influenza Inj Mdck Quad Pf 01/23/2018   Pneumococcal Polysaccharide-23 10/17/2018    Past Surgical History:  Procedure Laterality Date   CHOLECYSTECTOMY     COLONOSCOPY WITH PROPOFOL N/A 05/01/2018   Procedure: COLONOSCOPY WITH PROPOFOL;  Surgeon: Christena Deem, MD;  Location: [facility] ENDOSCOPY;  Service: Endoscopy;  Laterality: N/A;   deviated septum repair      Social History   Tobacco Use   Smoking status: Never   Smokeless tobacco: Never  Vaping Use   Vaping Use: Never used  Substance Use Topics   Alcohol use: Yes    Comment: occasional wine   Drug use: Never    Family History  Problem Relation Age of Onset   Diabetes Mother    Kidney disease Father    Diabetes Maternal Uncle         08/18/2022    8:09 AM 08/03/2022    8:52 AM  GAD 7 : Generalized Anxiety Score  Nervous, Anxious, on Edge 0 0  Control/stop worrying 0 0  Worry too much - different things 0 0  Trouble relaxing 0 0  Restless 0 0  Easily annoyed or irritable 0 0  Afraid - awful might happen 0 0  Total GAD 7 Score 0 0  Anxiety Difficulty Not difficult at all Not difficult at all       08/18/2022    8:09 AM 08/03/2022    8:52 AM  Depression screen PHQ 2/9  Decreased Interest 0 0  Down, Depressed, Hopeless 0 0  PHQ - 2 Score 0 0  Altered sleeping 0 0  Tired, decreased energy 0 0  Change in appetite 0 0  Feeling bad or failure about yourself  0 0  Trouble concentrating 0 0  Moving slowly or fidgety/restless 0 0  Suicidal thoughts 0 0  PHQ-9 Score 0 0  Difficult doing work/chores Not difficult at all Not difficult at all    BP Readings from Last 3 Encounters:  08/18/22 104/76  08/18/22 104/76  08/03/22 120/78    Wt Readings from Last 3 Encounters:  08/18/22 202 lb (91.6 kg)  08/18/22 202 lb  (91.6 kg)  08/03/22 204 lb (92.5 kg)    BP 104/76   Pulse 75   Temp 98 F (36.7 C) (Oral)   Ht 5\' 10"  (1.778 m)   Wt 202 lb (91.6 kg)   SpO2 95%   BMI 28.98 kg/m   Physical Exam Vitals and nursing note reviewed.  Constitutional:      Appearance: Normal appearance.  HENT:     Ears:     Comments: EAC clear bilaterally with good view of TM which is without effusion or erythema.     Nose: Nose normal.     Mouth/Throat:     Mouth: Mucous membranes are moist. No oral lesions.     Dentition: Normal dentition.     Pharynx: No posterior oropharyngeal erythema.  Eyes:     Extraocular Movements: Extraocular movements intact.  Conjunctiva/sclera: Conjunctivae normal.     Pupils: Pupils are equal, round, and reactive to light.  Neck:     Thyroid: No thyromegaly.  Cardiovascular:     Rate and Rhythm: Normal rate and regular rhythm.     Heart sounds: No murmur heard.    No friction rub. No gallop.     Comments: Pulses 2+ at radial, PT, DP bilaterally. No carotid bruit. No peripheral edema Pulmonary:     Effort: Pulmonary effort is normal.     Breath sounds: Normal breath sounds.  Abdominal:     General: Bowel sounds are normal.     Palpations: Abdomen is soft. There is no mass.     Tenderness: There is no abdominal tenderness.  Genitourinary:    Comments: Deferred genital exam. DRE performed today revealing uniformly enlarged smooth prostate without masses or overt tenderness. Hemoccult negative x2.  Musculoskeletal:     Comments: Full ROM with strength 5/5 bilateral upper and lower extremities  Lymphadenopathy:     Cervical: No cervical adenopathy.  Skin:    General: Skin is warm.     Capillary Refill: Capillary refill takes less than 2 seconds.     Findings: No lesion or rash.     Comments: Numerous SK of the back the largest of which measures about 15 mm in the left periscapular region  Neurological:     Mental Status: He is alert and oriented to person, place, and  time.     Gait: Gait is intact.  Psychiatric:        Mood and Affect: Mood normal.        Behavior: Behavior normal.     Recent Labs     Component Value Date/Time   NA 139 08/03/2022 1033   K 5.1 08/03/2022 1033   CL 103 08/03/2022 1033   CO2 22 08/03/2022 1033   GLUCOSE 92 08/03/2022 1033   BUN 15 08/03/2022 1033   CREATININE 1.57 (H) 08/03/2022 1033   CALCIUM 10.6 (H) 08/03/2022 1033   PROT 7.4 08/03/2022 1033   ALBUMIN 4.7 08/03/2022 1033   AST 21 08/03/2022 1033   ALT 24 08/03/2022 1033   ALKPHOS 84 08/03/2022 1033   BILITOT 0.7 08/03/2022 1033    Lab Results  Component Value Date   WBC 7.8 08/03/2022   HGB 15.4 08/03/2022   HCT 47.5 08/03/2022   MCV 92 08/03/2022   PLT 187 08/03/2022   No results found for: "HGBA1C" No results found for: "CHOL", "HDL", "LDLCALC", "LDLDIRECT", "TRIG", "CHOLHDL" No results found for: "TSH"   Assessment and Plan:  1. Annual physical exam Healthy patient. Encouraged healthy lifestyle including regular physical activity and consumption of whole fruits and vegetables.  Overdue for immunizations, patient thinks he had some, need to confirm vaccination status. From my record review, he appears to be due for Tdap, Shingrix, and Prevnar 20. Will defer today, but plan for future visit.   - POC Hemoccult Bld/Stl (1-Cd Office Dx)  2. Chronic kidney disease (CKD) stage G3a/A2, moderately decreased glomerular filtration rate (GFR) between 45-59 mL/min/1.73 square meter and albuminuria creatinine ratio between 30-299 mg/g (HCC) Last labs show stable kidney disease, continue to monitor periodically.   3. Benign prostatic hyperplasia with lower urinary tract symptoms, symptom details unspecified Exam today confirms BPH as expected. Stop tamsulosin due to probable orthostatic hypotension vs cough syncope/presyncope in the context of dizziness as AE of med. Start finasteride instead, patient education attached. Advised that should we need  tamsulosin in  the future (for stone or obstructive sx), we can still use it on PRN basis.   - finasteride (PROSCAR) 5 MG tablet; Take 1 tablet (5 mg total) by mouth daily.  Dispense: 30 tablet; Refill: 1  4. Subacute cough Probable asthma-induced cough, exacerbated by allergies. Could consider antihistamine. Exam normal today, plan to discuss potential switch from albuterol to Airsupra next time.   5. Seborrheic keratoses Patient reassured of benign appearance. One SK on the back is quite large, he thinks it changed color recently but it appears benign on exam today. Offered dermatology referral but declined at this time. Will continue to monitor for changes.    Return in about 4 weeks (around 09/15/2022) for fasting OV. Check lipids, uric acid, BMP (hypercalcemia), PTH, and Vit D.   AWV also completed today by Laurian Brim, CMA  Partially dictated using Dragon software. Any errors are unintentional.  Alvester Morin, PA-C, DMSc, Nutritionist [facility] and [facility] [phone]

## 2022-08-20 ENCOUNTER — Telehealth: Payer: Self-pay

## 2022-08-20 NOTE — Telephone Encounter (Signed)
-----   Message from Remo Lipps, [place] sent at 08/18/2022  9:30 AM EDT ----- Regarding: Vaccine hx Please call [facility] [place] to check patient's vaccine history with them. An order for Shingrix was sent 10/17/18 by his prior provider, but I don't think he ever got it. Just see if they have any record of vaccines administered to him - I think probably not based on Care Everywhere

## 2022-08-20 NOTE — Telephone Encounter (Signed)
Called [facility] in [place]. There are no records for vaccine given.  KP

## 2022-09-10 ENCOUNTER — Ambulatory Visit: Payer: Self-pay | Admitting: *Deleted

## 2022-09-10 NOTE — Telephone Encounter (Signed)
  Chief Complaint: Pharmacy gave him Tamsulofin instead of Finasteride.   Pt did not take any of it.   He took the bottle back to the pharmacy and they corrected it by giving him the Finasteride. Symptoms: None    Pt did not take any of the Tamsulofin when he realized it was the wrong medication. Frequency: Today Pertinent Negatives: Patient denies problems getting it resolved.   Problem corrected at the pharmacy. Disposition: [] ED /[] Urgent Care (no appt availability in office) / [] Appointment(In office/virtual)/ []  [facility]/ [x] Home Care/ [] Refused Recommended Disposition /[] [place] [facility] [facility]/ []  Follow-up with PCP Additional Notes: Message sent as an FYI.

## 2022-09-10 NOTE — Telephone Encounter (Signed)
Message from [place] sent at 09/10/2022 12:06 PM EDT  Summary: med incident   Pt called in states went to pharmacy to pick up med, but he says he was given the wrong med of tamsulofin. He should be have the finasteride (PROSCAR) 5 MG tablet, that shows on his med list. He is going to try going back to the pharmacy to return the wrong one and get the right one. The tamsulofin gave him side effects he says, so Dr Mordecai Maes switched him to finasteride. He hasn't taken the wrong one of the tamsulofin.          Call History   Type Contact Phone/Fax User  09/10/2022 12:03 PM EDT Phone (Incoming) Byrd Hesselbach, Willbert Moliere [address]" (Self) [phone] Rexene Edison) Eliseo Gum, [place]   Reason for Disposition  Caller has medicine question only, adult not sick, AND triager answers question    Pt. Called [place] to let us be aware of the medication mix up at the pharmacy.  It's been corrected.  Answer Assessment - Initial Assessment Questions 1. NAME of MEDICINE: "What medicine(s) are you calling about?"     [facility]   He was given Tamsulofin which he tried to take but could not tolerate the side effects.   It caused him to pass out. He is at the pharmacy now getting it exchanged for the correct medication which  is Proscar 5 mg.   He did not take any of the Tamsulofin so took the bottle back to the pharmacy.    They have gotten it straightened out now.   He just wanted [place] to be aware. I had checked his chart to be sure the correct medication was sent in and it was. 2. QUESTION: "What is your question?" (e.g., double dose of medicine, side effect)     No questions just wanted [place] to be aware of what happened at the pharmacy.   The pharmacy corrected the mistake. 3. PRESCRIBER: "Who prescribed the medicine?" Reason: if prescribed by specialist, call should be referred to that group.     Tillie Fantasia, PA 4. SYMPTOMS: "Do you have any symptoms?" If Yes, ask: "What symptoms are you having?"   "How bad are the symptoms (e.g., mild, moderate, severe)     N/A 5. PREGNANCY:  "Is there any chance that you are pregnant?" "When was your last menstrual period?"     N/A  Protocols used: Medication Question Call-A-AH

## 2022-09-10 NOTE — Telephone Encounter (Signed)
Noted  KP 

## 2022-10-04 ENCOUNTER — Ambulatory Visit: Payer: Medicare Other | Admitting: Physician Assistant

## 2022-10-11 ENCOUNTER — Ambulatory Visit: Payer: Medicare Other | Admitting: Physician Assistant

## 2022-10-25 ENCOUNTER — Other Ambulatory Visit: Payer: Self-pay

## 2022-10-25 ENCOUNTER — Encounter: Payer: Self-pay | Admitting: Physician Assistant

## 2022-10-25 ENCOUNTER — Ambulatory Visit (INDEPENDENT_AMBULATORY_CARE_PROVIDER_SITE_OTHER): Payer: Medicare Other | Admitting: Physician Assistant

## 2022-10-25 VITALS — BP 118/78 | HR 80 | Temp 98.1°F | Ht 70.0 in | Wt 198.0 lb

## 2022-10-25 DIAGNOSIS — M1A3720 Chronic gout due to renal impairment, left ankle and foot, without tophus (tophi): Secondary | ICD-10-CM | POA: Diagnosis not present

## 2022-10-25 DIAGNOSIS — L821 Other seborrheic keratosis: Secondary | ICD-10-CM

## 2022-10-25 DIAGNOSIS — Z23 Encounter for immunization: Secondary | ICD-10-CM | POA: Diagnosis not present

## 2022-10-25 DIAGNOSIS — E7800 Pure hypercholesterolemia, unspecified: Secondary | ICD-10-CM

## 2022-10-25 DIAGNOSIS — N1831 Chronic kidney disease, stage 3a: Secondary | ICD-10-CM

## 2022-10-25 DIAGNOSIS — J4520 Mild intermittent asthma, uncomplicated: Secondary | ICD-10-CM | POA: Insufficient documentation

## 2022-10-25 DIAGNOSIS — N401 Enlarged prostate with lower urinary tract symptoms: Secondary | ICD-10-CM | POA: Diagnosis not present

## 2022-10-25 MED ORDER — FINASTERIDE 5 MG PO TABS
5.0000 mg | ORAL_TABLET | Freq: Every day | ORAL | 3 refills | Status: DC
Start: 2022-10-25 — End: 2023-10-25

## 2022-10-25 MED ORDER — ALLOPURINOL 100 MG PO TABS
ORAL_TABLET | ORAL | 2 refills | Status: DC
Start: 2022-10-25 — End: 2022-10-27

## 2022-10-25 MED ORDER — ALBUTEROL SULFATE HFA 108 (90 BASE) MCG/ACT IN AERS
2.0000 | INHALATION_SPRAY | RESPIRATORY_TRACT | 5 refills | Status: AC | PRN
Start: 2022-10-25 — End: ?

## 2022-10-25 NOTE — Patient Instructions (Signed)
-  It was a pleasure to see you today! Please review your visit summary for helpful information -Lab results are usually available within 1-2 days and we will call once reviewed -I would encourage you to follow your care via MyChart where you can access lab results, notes, messages, and more -If you feel that we did a nice job today, please complete your after-visit survey and leave us a Google review! Your CMA today was Kieandra and your provider was Dan Waddell, PA-C, DMSc -Please return for follow-up in about 3 months  

## 2022-10-25 NOTE — Progress Notes (Signed)
Date:  10/25/2022   Name:  Kyle Lawrence   DOB:  1945/11/01   MRN:  [id number]   Chief Complaint: chronic conditions  HPI Kyle Lawrence presents for routine follow-up on chronic conditions, due for blood work today, no particular complaints.    Shortly after our last visit we stopped tamsulosin due to suspected hypotension and fatigue, instead starting finasteride which he has been taking with good compliance and good effect.  He also had hyperuricemia with gout, uric acid 8.2, for which he has been taking allopurinol 100 mg with good compliance.  May need dose increase  At his physical in May, we made note of a quite large SK on the back measuring about 15 mm.  Patient says this has not much changed, but his wife noticed a couple new moles on the back.  Endorses mild intermittent asthma, uses the inhaler "once in a blue moon".  Reports that recently when he uses the inhaler, he feels the need to cough.  Thinks it might be expired.   Medication list has been reviewed and updated.  Current Meds  Medication Sig   allopurinol (ZYLOPRIM) 100 MG tablet Take 0.5 tablets (50 mg total) by mouth daily for 14 days, THEN 1 tablet (100 mg total) daily.   Ascorbic Acid (VITA-C PO) Take by mouth.   VITAMIN D PO Take by mouth.   [DISCONTINUED] albuterol (PROVENTIL HFA;VENTOLIN HFA) 108 (90 Base) MCG/ACT inhaler Inhale 2 puffs into the lungs every 4 (four) hours as needed for wheezing or shortness of breath.   [DISCONTINUED] finasteride (PROSCAR) 5 MG tablet Take 1 tablet (5 mg total) by mouth daily.     Review of Systems  Constitutional:  Negative for fatigue and fever.  Respiratory:  Negative for chest tightness and shortness of breath.   Cardiovascular:  Negative for chest pain and palpitations.  Gastrointestinal:  Negative for abdominal pain.    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   Orchalgia 11/20/2013   Hyperlipidemia 11/20/2013   Hydrocele 11/20/2013   Glossitis 11/20/2013   Chronic kidney disease (CKD) stage G3a/A2, moderately decreased glomerular filtration rate (GFR) between 45-59 mL/min/1.73 square meter and albuminuria creatinine ratio between 30-299 mg/g (HCC) 11/20/2013   Gout 11/05/2013   Encysted hydrocele 09/22/2012   Benign prostatic hyperplasia with lower urinary tract symptoms 09/22/2012    Allergies  Allergen Reactions   Colchicine Diarrhea and Nausea Only   Flomax [Tamsulosin] Other (See Comments)    Tired and lightheaded, syncope    Immunization History  Administered Date(s) Administered   Influenza Inj Mdck Quad Pf 01/23/2018   PNEUMOCOCCAL CONJUGATE-20 10/25/2022   Pneumococcal Polysaccharide-23 10/17/2018    Past Surgical History:  Procedure Laterality Date   CHOLECYSTECTOMY     COLONOSCOPY WITH PROPOFOL N/A 05/01/2018   Procedure: COLONOSCOPY WITH PROPOFOL;  Surgeon: Christena Deem, MD;  Location: [facility] ENDOSCOPY;  Service: Endoscopy;  Laterality: N/A;   deviated septum repair      Social History   Tobacco Use   Smoking status: Never   Smokeless tobacco: Never  Vaping Use   Vaping status: Never Used  Substance Use Topics   Alcohol use: Yes    Comment:  occasional wine   Drug use: Never    Family History  Problem Relation Age of Onset   Diabetes Mother    Kidney disease Father    Diabetes Maternal Uncle         10/25/2022    8:03 AM 08/18/2022    8:09 AM 08/03/2022    8:52 AM  GAD 7 : Generalized Anxiety Score  Nervous, Anxious, on Edge 0 0 0  Control/stop worrying 0 0 0  Worry too much - different things 0 0 0  Trouble relaxing 0 0 0  Restless 0 0 0  Easily annoyed  or irritable 0 0 0  Afraid - awful might happen 0 0 0  Total GAD 7 Score 0 0 0  Anxiety Difficulty Not difficult at all Not difficult at all Not difficult at all       10/25/2022    8:02 AM 08/18/2022    8:09 AM 08/03/2022    8:52 AM  Depression screen PHQ 2/9  Decreased Interest 0 0 0  Down, Depressed, Hopeless 0 0 0  PHQ - 2 Score 0 0 0  Altered sleeping 0 0 0  Tired, decreased energy 0 0 0  Change in appetite 0 0 0  Feeling bad or failure about yourself  0 0 0  Trouble concentrating 0 0 0  Moving slowly or fidgety/restless 0 0 0  Suicidal thoughts 0 0 0  PHQ-9 Score 0 0 0  Difficult doing work/chores Not difficult at all Not difficult at all Not difficult at all    BP Readings from Last 3 Encounters:  10/25/22 118/78  08/18/22 104/76  08/18/22 104/76    Wt Readings from Last 3 Encounters:  10/25/22 198 lb (89.8 kg)  08/18/22 202 lb (91.6 kg)  08/18/22 202 lb (91.6 kg)    BP 118/78   Pulse 80   Temp 98.1 F (36.7 C) (Oral)   Ht 5\' 10"  (1.778 m)   Wt 198 lb (89.8 kg)   SpO2 95%   BMI 28.41 kg/m   Physical Exam Vitals and nursing note reviewed.  Constitutional:      Appearance: Normal appearance.  Neck:     Vascular: No carotid bruit.  Cardiovascular:     Rate and Rhythm: Normal rate and regular rhythm.     Heart sounds: No murmur heard.    No friction rub. No gallop.  Pulmonary:     Effort: Pulmonary effort is normal.     Breath sounds: Normal breath sounds.  Abdominal:     General: There is no distension.  Musculoskeletal:        General: Normal range of motion.  Skin:    General: Skin is warm and dry.     Comments: Numerous SK of the back the largest of which measures about 15 mm in the left periscapular region   Neurological:     Mental Status: He is alert and oriented to person, place, and time.     Gait: Gait is intact.  Psychiatric:        Mood and Affect: Mood and affect normal.     Recent Labs     Component Value Date/Time   NA 139  08/03/2022 1033   K 5.1 08/03/2022 1033   CL 103 08/03/2022 1033   CO2 22 08/03/2022 1033   GLUCOSE 92 08/03/2022 1033   BUN 15 08/03/2022 1033   CREATININE 1.57 (H) 08/03/2022 1033   CALCIUM 10.6 (H) 08/03/2022 1033   PROT  7.4 08/03/2022 1033   ALBUMIN 4.7 08/03/2022 1033   AST 21 08/03/2022 1033   ALT 24 08/03/2022 1033   ALKPHOS 84 08/03/2022 1033   BILITOT 0.7 08/03/2022 1033    Lab Results  Component Value Date   WBC 7.8 08/03/2022   HGB 15.4 08/03/2022   HCT 47.5 08/03/2022   MCV 92 08/03/2022   PLT 187 08/03/2022   No results found for: "HGBA1C" No results found for: "CHOL", "HDL", "LDLCALC", "LDLDIRECT", "TRIG", "CHOLHDL" No results found for: "TSH"   Assessment and Plan:  1. Chronic kidney disease (CKD) stage G3a/A2, moderately decreased glomerular filtration rate (GFR) between 45-59 mL/min/1.73 square meter and albuminuria creatinine ratio between 30-299 mg/g (HCC) Check kidney labs as below.  He has been stable over the last few lab draws - Comprehensive metabolic panel - VITAMIN D 25 Hydroxy (Vit-D Deficiency, Fractures) - Parathyroid hormone, intact (no Ca)  2. Benign prostatic hyperplasia with lower urinary tract symptoms, symptom details unspecified Continue finasteride as below, which seems to be helping with his BPH symptoms - finasteride (PROSCAR) 5 MG tablet; Take 1 tablet (5 mg total) by mouth daily.  Dispense: 90 tablet; Refill: 3  3. Pure hypercholesterolemia Check fasting lipids today - Lipid panel  4. 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 Seborrheic keratoses Discussed with patient I do not have any immediate concerns with any of the lesions on his back, but because there are so many and one of them is quite large, I would recommend dermatology referral for expert opinion and complete skin check.  He is in agreement with this and we are  submitting referral today. - Ambulatory referral to Dermatology  6. Mild intermittent asthma without complication Refill inhaler for as needed use - albuterol (VENTOLIN HFA) 108 (90 Base) MCG/ACT inhaler; Inhale 2 puffs into the lungs every 4 (four) hours as needed for wheezing or shortness of breath.  Dispense: 1 each; Refill: 5  7. Need for Streptococcus pneumoniae vaccination Prevnar 20 administered today. - Pneumococcal conjugate vaccine 20-valent   Patient encouraged to get Tdap booster and Shingrix vaccine from his local pharmacy at his convenience, and he intends to proceed with this.  Return in about 3 months (around 01/25/2023) for OV f/u chronic conditions.    Alvester Morin, PA-C, DMSc, Nutritionist [facility] Primary Care and Sports Medicine [facility] [phone]

## 2022-10-27 ENCOUNTER — Other Ambulatory Visit: Payer: Self-pay | Admitting: Physician Assistant

## 2022-10-27 DIAGNOSIS — E782 Mixed hyperlipidemia: Secondary | ICD-10-CM

## 2022-10-27 DIAGNOSIS — M1A3720 Chronic gout due to renal impairment, left ankle and foot, without tophus (tophi): Secondary | ICD-10-CM

## 2022-10-27 MED ORDER — ALLOPURINOL 100 MG PO TABS: 100.0000 mg | ORAL_TABLET | Freq: Every day | ORAL | 1 refills | Status: DC

## 2022-10-27 MED ORDER — ROSUVASTATIN CALCIUM 5 MG PO TABS: 5.0000 mg | ORAL_TABLET | Freq: Every day | ORAL | 2 refills | Status: DC

## 2022-10-28 ENCOUNTER — Other Ambulatory Visit: Payer: Self-pay | Admitting: Physician Assistant

## 2022-10-28 ENCOUNTER — Ambulatory Visit: Payer: Self-pay

## 2022-10-28 ENCOUNTER — Telehealth: Payer: Self-pay

## 2022-10-28 DIAGNOSIS — R053 Chronic cough: Secondary | ICD-10-CM

## 2022-10-28 NOTE — Telephone Encounter (Signed)
Please review.  KP

## 2022-10-28 NOTE — Telephone Encounter (Signed)
Kyle Lawrence called Kyle Lawrence for lab results. Shared provider's note. Pt also mentioned ongoing cough has not resolved, and albuterol makes cough much worse. Triaged pt in separate encounter.  Kyle Lipps, PA 10/27/2022  3:37 PM EDT     Kyle Lawrence, overall your labs look good.  Stable kidney disease and calcium slightly down from previous.  Your uric acid/gout test is now in goal range, so continue the current dose of allopurinol -will send a new prescription for you to fill at a higher quantity (90 tablets per fill).  Vitamin D looks great as well.   You do have high cholesterol and I would suggest starting medication to correct this and decrease risk for heart attack and stroke.  I am sending a low-dose of Crestor 5 mg once daily to get you started.  Please let me know if you have any issues with this medication.   As always, we are available for any questions.   Kyle Morin, PA-C, DMSc, Nutritionist [facility] Primary Care and Sports Medicine [facility] [phone]

## 2022-10-28 NOTE — Telephone Encounter (Signed)
  Chief Complaint: Cough - Albuterol making cough worse Symptoms: above Frequency: ongoing Pertinent Negatives: Patient denies  Disposition: [] ED /[] Urgent Care (no appt availability in office) / [] Appointment(In office/virtual)/ []  [facility] [facility]/ [] Home Care/ [] Refused Recommended Disposition /[] [place] [facility]/ [x]  Follow-up with PCP Additional Notes: Pt called for lab results. Pt mentioned that he is still having trouble with coughing. Pt states that the albuterol makes his cough much worse. Pt declined an OV, but is interested in a chest xray and pulmonology consult. Pt will go to ED or call EMS if needed.  Reason for Disposition  Cough has been present for > 3 weeks  Answer Assessment - Initial Assessment Questions 1. ONSET: "When did the cough begin?"      Ongoing 2. SEVERITY: "How bad is the cough today?"      mild 5. DIFFICULTY BREATHING: "Are you having difficulty breathing?" If Yes, ask: "How bad is it?" (e.g., mild, moderate, severe)    - MILD: No SOB at rest, mild SOB with walking, speaks normally in sentences, can lie down, no retractions, pulse < 100.    - MODERATE: SOB at rest, SOB with minimal exertion and prefers to sit, cannot lie down flat, speaks in phrases, mild retractions, audible wheezing, pulse 100-120.    - SEVERE: Very SOB at rest, speaks in single words, struggling to breathe, sitting hunched forward, retractions, pulse > 120      mild 8. LUNG HISTORY: "Do you have any history of lung disease?"  (e.g., pulmonary embolus, asthma, emphysema)     yes  Protocols used: Cough - Acute Non-Productive-A-AH

## 2022-11-02 ENCOUNTER — Ambulatory Visit
Admission: RE | Admit: 2022-11-02 | Discharge: 2022-11-02 | Disposition: A | Discharge status: Home / Self Care | Payer: Medicare Other | Source: Ambulatory Visit | Attending: Physician Assistant | Admitting: Physician Assistant

## 2022-11-02 ENCOUNTER — Ambulatory Visit
Admission: RE | Admit: 2022-11-02 | Discharge: 2022-11-02 | Disposition: A | Discharge status: Home / Self Care | Payer: Medicare Other | Attending: Physician Assistant | Admitting: Physician Assistant

## 2022-11-02 DIAGNOSIS — I700 Atherosclerosis of aorta: Secondary | ICD-10-CM | POA: Diagnosis not present

## 2022-11-02 DIAGNOSIS — R053 Chronic cough: Secondary | ICD-10-CM | POA: Insufficient documentation

## 2022-11-02 DIAGNOSIS — R0602 Shortness of breath: Secondary | ICD-10-CM | POA: Diagnosis not present

## 2022-11-02 DIAGNOSIS — R918 Other nonspecific abnormal finding of lung field: Secondary | ICD-10-CM | POA: Diagnosis not present

## 2023-01-06 DIAGNOSIS — L538 Other specified erythematous conditions: Secondary | ICD-10-CM | POA: Diagnosis not present

## 2023-01-06 DIAGNOSIS — D2262 Melanocytic nevi of left upper limb, including shoulder: Secondary | ICD-10-CM | POA: Diagnosis not present

## 2023-01-06 DIAGNOSIS — D2261 Melanocytic nevi of right upper limb, including shoulder: Secondary | ICD-10-CM | POA: Diagnosis not present

## 2023-01-06 DIAGNOSIS — L820 Inflamed seborrheic keratosis: Secondary | ICD-10-CM | POA: Diagnosis not present

## 2023-01-06 DIAGNOSIS — L2989 Other pruritus: Secondary | ICD-10-CM | POA: Diagnosis not present

## 2023-01-06 DIAGNOSIS — D225 Melanocytic nevi of trunk: Secondary | ICD-10-CM | POA: Diagnosis not present

## 2023-01-22 ENCOUNTER — Other Ambulatory Visit: Payer: Self-pay | Admitting: Physician Assistant

## 2023-01-22 DIAGNOSIS — E782 Mixed hyperlipidemia: Secondary | ICD-10-CM

## 2023-01-24 NOTE — Telephone Encounter (Signed)
Requested Prescriptions  Pending Prescriptions Disp Refills   rosuvastatin (CRESTOR) 5 MG tablet [Pharmacy Med Name: ROSUVASTATIN CALCIUM 5 MG TAB] 90 tablet 0    Sig: TAKE 1 TABLET (5 MG TOTAL) BY MOUTH DAILY.     Cardiovascular:  Antilipid - Statins 2 Failed - 01/22/2023  1:12 AM      Failed - Cr in normal range and within 360 days    Creatinine, Ser  Date Value Ref Range Status  10/25/2022 1.47 (H) 0.76 - 1.27 mg/dL Final         Failed - Lipid Panel in normal range within the last 12 months    Cholesterol, Total  Date Value Ref Range Status  10/25/2022 220 (H) 100 - 199 mg/dL Final   LDL Chol Calc (NIH)  Date Value Ref Range Status  10/25/2022 142 (H) 0 - 99 mg/dL Final   HDL  Date Value Ref Range Status  10/25/2022 37 (L) >39 mg/dL Final   Triglycerides  Date Value Ref Range Status  10/25/2022 228 (H) 0 - 149 mg/dL Final         Passed - Patient is not pregnant      Passed - Valid encounter within last 12 months    Recent Outpatient Visits           3 months ago Chronic kidney disease (CKD) stage G3a/A2, moderately decreased glomerular filtration rate (GFR) between 45-59 mL/min/1.73 square meter and albuminuria creatinine ratio between 30-299 mg/g [facility])   [place] [facility] at [facility] Mel[place] Alar, [place]   5 months ago Annual physical exam   [facility] Medicine at [facility], [place]ton Alar, [place]   5 months ago Dysuria   [facility] Primary Care & Sports Medicine at [facility], Melton Alar, [place]       Future Appointments             Tomorrow Mordecai Maes, Melton Alar, PA [facility] Primary Care & Sports Medicine at [facility], [facility]

## 2023-01-25 ENCOUNTER — Ambulatory Visit (INDEPENDENT_AMBULATORY_CARE_PROVIDER_SITE_OTHER): Payer: Medicare Other | Admitting: Physician Assistant

## 2023-01-25 ENCOUNTER — Encounter: Payer: Self-pay | Admitting: Physician Assistant

## 2023-01-25 VITALS — BP 122/82 | HR 78 | Ht 70.0 in | Wt 199.0 lb

## 2023-01-25 DIAGNOSIS — N1831 Chronic kidney disease, stage 3a: Secondary | ICD-10-CM | POA: Diagnosis not present

## 2023-01-25 DIAGNOSIS — M1A3720 Chronic gout due to renal impairment, left ankle and foot, without tophus (tophi): Secondary | ICD-10-CM | POA: Diagnosis not present

## 2023-01-25 DIAGNOSIS — E782 Mixed hyperlipidemia: Secondary | ICD-10-CM

## 2023-01-25 NOTE — Progress Notes (Signed)
Date:  01/25/2023   Name:  Kyle Lawrence   DOB:  Apr 15, 1945   MRN:  [id number]   Chief Complaint: Chronic Conditions and Benign Prostatic Hypertrophy (Concerned about finasteride causing itching on back )  HPI Kyle Lawrence presents for 3 month follow-up on chronic conditions namely CKD G3 A/A2, HLD, and BPH.  Following lipid panel at our last visit, we started rosuvastatin 5 mg daily which he has been taking with good compliance and tolerance.  Reports gout flare left great toe 2wk ago but shocked because he only consumed a small amount of wine and some homemade ice cream. Uric acid was at goal on allopurinol last check. The flare has improved with ibuprofen and tylenol, but is still slightly stiff.   When asked about flu shot, he says he thought he got it last time.  By our record, he received his Prevnar 20 last visit and then next day went to [facility] and got his first dose shingles shot and one other immunization.  I have asked him to contact [facility] and let me know which vaccines he received.  Medication list has been reviewed and updated.  Current Meds  Medication Sig   albuterol (VENTOLIN HFA) 108 (90 Base) MCG/ACT inhaler Inhale 2 puffs into the lungs every 4 (four) hours as needed for wheezing or shortness of breath.   allopurinol (ZYLOPRIM) 100 MG tablet Take 1 tablet (100 mg total) by mouth daily.   Ascorbic Acid (VITA-C PO) Take by mouth.   finasteride (PROSCAR) 5 MG tablet Take 1 tablet (5 mg total) by mouth daily.   rosuvastatin (CRESTOR) 5 MG tablet TAKE 1 TABLET (5 MG TOTAL) BY MOUTH DAILY.   VITAMIN D PO Take by mouth.     Review of Systems  Constitutional:  Negative for fatigue and fever.  Respiratory:  Negative for chest tightness and shortness of breath.   Cardiovascular:  Negative for chest pain and palpitations.  Gastrointestinal:  Negative for abdominal pain.    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   Orchalgia 11/20/2013   Hyperlipidemia 11/20/2013   Hydrocele 11/20/2013   Glossitis 11/20/2013   Chronic kidney disease (CKD) stage G3a/A2, moderately decreased glomerular filtration rate (GFR) between 45-59 mL/min/1.73 square meter and albuminuria creatinine ratio between 30-299 mg/g (HCC) 11/20/2013   Gout 11/05/2013   Encysted hydrocele 09/22/2012   Benign prostatic hyperplasia with lower urinary tract symptoms 09/22/2012    Allergies  Allergen Reactions   Colchicine Diarrhea and Nausea Only   Flomax [Tamsulosin] Other (See Comments)    Tired and lightheaded, syncope    Immunization History  Administered Date(s) Administered   Influenza Inj Mdck Quad Pf 01/23/2018   PNEUMOCOCCAL CONJUGATE-20 10/25/2022   Pneumococcal Polysaccharide-23 10/17/2018   Zoster Recombinant(Shingrix) 10/26/2022    Past Surgical History:  Procedure Laterality Date   CHOLECYSTECTOMY     COLONOSCOPY WITH PROPOFOL N/A 05/01/2018   Procedure: COLONOSCOPY WITH PROPOFOL;  Surgeon: Christena Deem, MD;  Location: [facility] ENDOSCOPY;  Service: Endoscopy;  Laterality: N/A;   deviated septum repair      Social History   Tobacco Use   Smoking status: Never   Smokeless tobacco: Never  Vaping Use  Vaping status: Never Used  Substance Use Topics   Alcohol use: Yes    Comment: occasional wine   Drug use: Never    Family History  Problem Relation Age of Onset   Diabetes Mother    Kidney disease Father    Diabetes Maternal Uncle         01/25/2023    8:07 AM 10/25/2022    8:03 AM 08/18/2022    8:09 AM 08/03/2022    8:52 AM  GAD 7 : Generalized Anxiety Score  Nervous, Anxious, on Edge 0 0 0 0  Control/stop worrying 0  0 0 0  Worry too much - different things 0 0 0 0  Trouble relaxing 0 0 0 0  Restless 0 0 0 0  Easily annoyed or irritable 0 0 0 0  Afraid - awful might happen 0 0 0 0  Total GAD 7 Score 0 0 0 0  Anxiety Difficulty Not difficult at all Not difficult at all Not difficult at all Not difficult at all       01/25/2023    8:06 AM 10/25/2022    8:02 AM 08/18/2022    8:09 AM  Depression screen PHQ 2/9  Decreased Interest 0 0 0  Down, Depressed, Hopeless 0 0 0  PHQ - 2 Score 0 0 0  Altered sleeping 0 0 0  Tired, decreased energy 0 0 0  Change in appetite 0 0 0  Feeling bad or failure about yourself  0 0 0  Trouble concentrating 0 0 0  Moving slowly or fidgety/restless 0 0 0  Suicidal thoughts 0 0 0  PHQ-9 Score 0 0 0  Difficult doing work/chores Not difficult at all Not difficult at all Not difficult at all    BP Readings from Last 3 Encounters:  01/25/23 122/82  10/25/22 118/78  08/18/22 104/76    Wt Readings from Last 3 Encounters:  01/25/23 199 lb (90.3 kg)  10/25/22 198 lb (89.8 kg)  08/18/22 202 lb (91.6 kg)    BP 122/82   Pulse 78   Ht 5\' 10"  (1.778 m)   Wt 199 lb (90.3 kg)   SpO2 97%   BMI 28.55 kg/m   Physical Exam Vitals and nursing note reviewed.  Constitutional:      Appearance: Normal appearance.  Cardiovascular:     Rate and Rhythm: Normal rate and regular rhythm.     Heart sounds: No murmur heard.    No friction rub. No gallop.  Pulmonary:     Effort: Pulmonary effort is normal.     Breath sounds: Normal breath sounds.  Abdominal:     General: There is no distension.  Musculoskeletal:        General: Normal range of motion.  Feet:     Comments: Examination of the left great toe reveals subtle erythema of the dorsal foot between the first and second toes, slightly tender and slightly stiff Skin:    General: Skin is warm and dry.  Neurological:     Mental Status: He is alert and oriented to person, place, and time.     Gait: Gait is intact.   Psychiatric:        Mood and Affect: Mood and affect normal.     Recent Labs     Component Value Date/Time   NA 140 10/25/2022 0914   K 5.0 10/25/2022 0914   CL 103 10/25/2022 0914   CO2 24 10/25/2022 0914   GLUCOSE 91 10/25/2022 0914  BUN 19 10/25/2022 0914   CREATININE 1.47 (H) 10/25/2022 0914   CALCIUM 10.4 (H) 10/25/2022 0914   PROT 7.2 10/25/2022 0914   ALBUMIN 4.5 10/25/2022 0914   AST 17 10/25/2022 0914   ALT 23 10/25/2022 0914   ALKPHOS 94 10/25/2022 0914   BILITOT 0.4 10/25/2022 0914    Lab Results  Component Value Date   WBC 7.8 08/03/2022   HGB 15.4 08/03/2022   HCT 47.5 08/03/2022   MCV 92 08/03/2022   PLT 187 08/03/2022   No results found for: "HGBA1C" Lab Results  Component Value Date   CHOL 220 (H) 10/25/2022   HDL 37 (L) 10/25/2022   LDLCALC 142 (H) 10/25/2022   TRIG 228 (H) 10/25/2022   CHOLHDL 5.9 (H) 10/25/2022   No results found for: "TSH"   Assessment and Plan:  1. Chronic kidney disease (CKD) stage G3a/A2, moderately decreased glomerular filtration rate (GFR) between 45-59 mL/min/1.73 square meter and albuminuria creatinine ratio between 30-299 mg/g (HCC) Check BMP today along with other labs - Basic metabolic panel  2. Mixed hyperlipidemia Repeat fasting lipids after being on rosuvastatin 5 mg for 3 months.  Refill rosuvastatin pending lab results. - Lipid panel  3. 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  Documented intolerance to colchicine.  Continue using ibuprofen for flares, will focus on prevention.  Refill allopurinol pending lab results. - Uric acid   Return in about 4 months (around 05/25/2023) for OV f/u chronic conditions.    Alvester Morin, PA-C, DMSc, Nutritionist [facility] [place] Primary Care and Sports Medicine MedCenter [facility] [phone]

## 2023-01-25 NOTE — Patient Instructions (Signed)

## 2023-01-26 ENCOUNTER — Other Ambulatory Visit: Payer: Self-pay | Admitting: Physician Assistant

## 2023-01-26 ENCOUNTER — Other Ambulatory Visit: Payer: Self-pay

## 2023-01-26 DIAGNOSIS — E782 Mixed hyperlipidemia: Secondary | ICD-10-CM

## 2023-01-26 DIAGNOSIS — M1A3720 Chronic gout due to renal impairment, left ankle and foot, without tophus (tophi): Secondary | ICD-10-CM

## 2023-01-26 LAB — BASIC METABOLIC PANEL
BUN/Creatinine Ratio: 14 (ref 10–24)
BUN: 20 mg/dL (ref 8–27)
CO2: 19 mmol/L — ABNORMAL LOW (ref 20–29)
Calcium: 10.4 mg/dL — ABNORMAL HIGH (ref 8.6–10.2)
Chloride: 106 mmol/L (ref 96–106)
Creatinine, Ser: 1.38 mg/dL — ABNORMAL HIGH (ref 0.76–1.27)
Glucose: 90 mg/dL (ref 70–99)
Potassium: 4.3 mmol/L (ref 3.5–5.2)
Sodium: 143 mmol/L (ref 134–144)
eGFR: 53 mL/min/{1.73_m2} — ABNORMAL LOW (ref >59)

## 2023-01-26 LAB — LIPID PANEL
Chol/HDL Ratio: 3.7 ratio (ref 0.0–5.0)
Cholesterol, Total: 140 mg/dL (ref 100–199)
HDL: 38 mg/dL — ABNORMAL LOW (ref >39)
LDL Chol Calc (NIH): 77 mg/dL (ref 0–99)
Triglycerides: 140 mg/dL (ref 0–149)
VLDL Cholesterol Cal: 25 mg/dL (ref 5–40)

## 2023-01-26 LAB — URIC ACID: Uric Acid: 5 mg/dL (ref 3.8–8.4)

## 2023-01-26 MED ORDER — VITAMIN D 125 MCG (5000 UT) PO CAPS: 5000.0000 [IU] | ORAL_CAPSULE | Freq: Every day | ORAL | Status: AC

## 2023-01-26 MED ORDER — ROSUVASTATIN CALCIUM 5 MG PO TABS: 5.0000 mg | ORAL_TABLET | Freq: Every day | ORAL | 3 refills | Status: DC

## 2023-01-26 MED ORDER — ALLOPURINOL 100 MG PO TABS: 100.0000 mg | ORAL_TABLET | Freq: Every day | ORAL | 3 refills | Status: DC

## 2023-02-03 ENCOUNTER — Other Ambulatory Visit: Payer: Self-pay | Admitting: Physician Assistant

## 2023-02-03 DIAGNOSIS — M1A3720 Chronic gout due to renal impairment, left ankle and foot, without tophus (tophi): Secondary | ICD-10-CM

## 2023-05-25 ENCOUNTER — Encounter: Payer: Self-pay | Admitting: Physician Assistant

## 2023-05-25 ENCOUNTER — Ambulatory Visit (INDEPENDENT_AMBULATORY_CARE_PROVIDER_SITE_OTHER): Payer: Medicare Other | Admitting: Physician Assistant

## 2023-05-25 VITALS — BP 126/86 | HR 77 | Temp 98.0°F | Ht 70.0 in | Wt 205.0 lb

## 2023-05-25 DIAGNOSIS — Z860101 Personal history of adenomatous and serrated colon polyps: Secondary | ICD-10-CM

## 2023-05-25 DIAGNOSIS — E782 Mixed hyperlipidemia: Secondary | ICD-10-CM

## 2023-05-25 DIAGNOSIS — N1831 Chronic kidney disease, stage 3a: Secondary | ICD-10-CM

## 2023-05-25 NOTE — Assessment & Plan Note (Signed)
 We discussed consideration of repeat colonoscopy. Despite his age, life expectancy is such that I think he would benefit from another colonoscopy. He will think about it.

## 2023-05-25 NOTE — Assessment & Plan Note (Signed)
 Last lipids at goal with current dose of rosuvastatin, repeat fasting lipids today, continue at the current dose for now.

## 2023-05-25 NOTE — Progress Notes (Signed)
 Date:  05/25/2023   Name:  Kyle Lawrence   DOB:  07/23/1945   MRN:  [id number]   Chief Complaint: Medical Management of Chronic Issues  HPI Maurice March returns to clinic today for 59-month follow-up on chronic conditions, namely CKD 3a and HLD.  He has had no gout flares since starting low-dose allopurinol 100 mg daily.  He mentions that his son [place], so Maurice March and his wife will be moving there - Maurice March has mixed feelings about this.  Taking all meds with good compliance and tolerance.  Feels well overall.  Inquires about nephrology referral, [place] he would first like to check it out before I send a referral.  Mentions his lips sometimes turn blue and feel "mushy", particularly in colder environments such as white water rafting.  He is otherwise not short of breath, aside from a little deconditioning over the winter.  His longstanding history of intermittent asthma/asthmatic bronchitis, and a CXR 11/02/2022 suggested that the lung appearance may be consistent with COPD.    Medication list has been reviewed and updated.  Current Meds  Medication Sig   albuterol (VENTOLIN HFA) 108 (90 Base) MCG/ACT inhaler Inhale 2 puffs into the lungs every 4 (four) hours as needed for wheezing or shortness of breath.   allopurinol (ZYLOPRIM) 100 MG tablet Take 1 tablet (100 mg total) by mouth daily.   Ascorbic Acid (VITA-C PO) Take by mouth.   Cholecalciferol (VITAMIN D) 125 MCG (5000 UT) CAPS Take 5,000 Units by mouth daily.   finasteride (PROSCAR) 5 MG tablet Take 1 tablet (5 mg total) by mouth daily.   rosuvastatin (CRESTOR) 5 MG tablet Take 1 tablet (5 mg total) by mouth daily.     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   Orchalgia 11/20/2013   Hyperlipidemia 11/20/2013   Hydrocele 11/20/2013   Glossitis 11/20/2013   Chronic kidney disease (CKD) stage G3a/A2, moderately decreased glomerular filtration rate (GFR) between 45-59 mL/min/1.73 square meter and albuminuria creatinine ratio between 30-299 mg/g (HCC) 11/20/2013   Gout 11/05/2013   Encysted hydrocele 09/22/2012   Benign prostatic hyperplasia with lower urinary tract symptoms 09/22/2012    Allergies  Allergen Reactions   Colchicine Diarrhea and Nausea Only   Flomax [Tamsulosin] Other (See Comments)    Tired and lightheaded, syncope    Immunization History  Administered Date(s) Administered   Fluad Quad(high Dose 65+) 01/26/2023   Influenza Inj Mdck Quad Pf 01/23/2018   PNEUMOCOCCAL CONJUGATE-20 10/25/2022   Pneumococcal Polysaccharide-23 10/17/2018   Tdap 10/26/2022   Zoster Recombinant(Shingrix) 10/26/2022    Past Surgical History:  Procedure Laterality Date   CHOLECYSTECTOMY     COLONOSCOPY WITH PROPOFOL N/A 05/01/2018   Procedure: COLONOSCOPY WITH PROPOFOL;  Surgeon: Christena Deem, MD;  Location: [facility] ENDOSCOPY;  Service: Endoscopy;  Laterality: N/A;   deviated septum repair      Social History   Tobacco Use   Smoking status: Never   Smokeless tobacco: Never  Vaping Use   Vaping status: Never Used  Substance Use Topics   Alcohol use:  Yes    Comment: occasional wine   Drug use: Never    Family History  Problem Relation Age of Onset   Diabetes Mother    Kidney disease Father    Diabetes Maternal Uncle         05/25/2023    8:03 AM 01/25/2023    8:07 AM 10/25/2022    8:03 AM 08/18/2022    8:09 AM  GAD 7 : Generalized Anxiety Score  Nervous, Anxious, on Edge 0 0 0 0  Control/stop worrying 0 0 0 0  Worry too much - different things 0 0 0 0   Trouble relaxing 0 0 0 0  Restless 0 0 0 0  Easily annoyed or irritable 0 0 0 0  Afraid - awful might happen 0 0 0 0  Total GAD 7 Score 0 0 0 0  Anxiety Difficulty Not difficult at all Not difficult at all Not difficult at all Not difficult at all       05/25/2023    8:03 AM 01/25/2023    8:06 AM 10/25/2022    8:02 AM  Depression screen PHQ 2/9  Decreased Interest 0 0 0  Down, Depressed, Hopeless 0 0 0  PHQ - 2 Score 0 0 0  Altered sleeping  0 0  Tired, decreased energy  0 0  Change in appetite  0 0  Feeling bad or failure about yourself   0 0  Trouble concentrating  0 0  Moving slowly or fidgety/restless  0 0  Suicidal thoughts  0 0  PHQ-9 Score  0 0  Difficult doing work/chores  Not difficult at all Not difficult at all    BP Readings from Last 3 Encounters:  05/25/23 126/86  01/25/23 122/82  10/25/22 118/78    Wt Readings from Last 3 Encounters:  05/25/23 205 lb (93 kg)  01/25/23 199 lb (90.3 kg)  10/25/22 198 lb (89.8 kg)    BP 126/86 (Cuff Size: Large)   Pulse 77   Temp 98 F (36.7 C)   Ht 5\' 10"  (1.778 m)   Wt 205 lb (93 kg)   SpO2 95%   BMI 29.41 kg/m   Physical Exam Vitals and nursing note reviewed.  Constitutional:      Appearance: Normal appearance.  Cardiovascular:     Rate and Rhythm: Normal rate and regular rhythm.     Heart sounds: No murmur heard.    No friction rub. No gallop.  Pulmonary:     Effort: Pulmonary effort is normal.     Breath sounds: Normal breath sounds.  Abdominal:     General: There is no distension.  Musculoskeletal:        General: Normal range of motion.  Skin:    General: Skin is warm and dry.  Neurological:     Mental Status: He is alert and oriented to person, place, and time.     Gait: Gait is intact.  Psychiatric:        Mood and Affect: Mood and affect normal.     Recent Labs     Component Value Date/Time   NA 143 01/25/2023 0904   K 4.3 01/25/2023 0904   CL 106 01/25/2023 0904   CO2 19 (L)  01/25/2023 0904   GLUCOSE 90 01/25/2023 0904   BUN 20 01/25/2023 0904   CREATININE 1.38 (H) 01/25/2023 0904   CALCIUM 10.4 (H) 01/25/2023 0904   PROT 7.2 10/25/2022 0914   ALBUMIN 4.5 10/25/2022 0914  AST 17 10/25/2022 0914   ALT 23 10/25/2022 0914   ALKPHOS 94 10/25/2022 0914   BILITOT 0.4 10/25/2022 0914    Lab Results  Component Value Date   WBC 7.8 08/03/2022   HGB 15.4 08/03/2022   HCT 47.5 08/03/2022   MCV 92 08/03/2022   PLT 187 08/03/2022   No results found for: "HGBA1C" Lab Results  Component Value Date   CHOL 140 01/25/2023   HDL 38 (L) 01/25/2023   LDLCALC 77 01/25/2023   TRIG 140 01/25/2023   CHOLHDL 3.7 01/25/2023   No results found for: "TSH"   Assessment and Plan:  Chronic kidney disease (CKD) stage G3a/A2, moderately decreased glomerular filtration rate (GFR) between 45-59 mL/min/1.73 square meter and albuminuria creatinine ratio between 30-299 mg/g (HCC) Assessment & Plan: Repeat CBC and CMP today to assess for stability. Patient to let me know when he would like a nephrology referral.   Orders: -     CBC with Differential/Platelet -     Comprehensive metabolic panel  Mixed hyperlipidemia Assessment & Plan: Last lipids at goal with current dose of rosuvastatin, repeat fasting lipids today, continue at the current dose for now.  Orders: -     Lipid panel  History of adenomatous polyp of colon Assessment & Plan: We discussed consideration of repeat colonoscopy. Despite his age, life expectancy is such that I think he would benefit from another colonoscopy. He will think about it.       Return in about 4 months (around 09/24/2023) for OV f/u chronic conditions.   Today's visit billed for provider time of 35 minutes inclusive of chart review, physical exam, discussing multiple chronic conditions, ordering and interpretation of labs.   Alvester Morin, PA-C, DMSc, Nutritionist [facility] and Sports Medicine [facility]  [facility] [phone]

## 2023-05-25 NOTE — Assessment & Plan Note (Signed)
 Repeat CBC and CMP today to assess for stability. Patient to let me know when he would like a nephrology referral.

## 2023-05-26 LAB — COMPREHENSIVE METABOLIC PANEL
ALT: 13 IU/L (ref 0–44)
AST: 19 IU/L (ref 0–40)
Albumin: 4.4 g/dL (ref 3.8–4.8)
Alkaline Phosphatase: 72 IU/L (ref 44–121)
BUN/Creatinine Ratio: 20 (ref 10–24)
BUN: 26 mg/dL (ref 8–27)
Bilirubin Total: 0.5 mg/dL (ref 0.0–1.2)
CO2: 19 mmol/L — ABNORMAL LOW (ref 20–29)
Calcium: 9.7 mg/dL (ref 8.6–10.2)
Chloride: 109 mmol/L — ABNORMAL HIGH (ref 96–106)
Creatinine, Ser: 1.33 mg/dL — ABNORMAL HIGH (ref 0.76–1.27)
Globulin, Total: 2.3 g/dL (ref 1.5–4.5)
Glucose: 91 mg/dL (ref 70–99)
Potassium: 4.3 mmol/L (ref 3.5–5.2)
Sodium: 142 mmol/L (ref 134–144)
Total Protein: 6.7 g/dL (ref 6.0–8.5)
eGFR: 55 mL/min/{1.73_m2} — ABNORMAL LOW (ref >59)

## 2023-05-26 LAB — CBC WITH DIFFERENTIAL/PLATELET
Basophils Absolute: 0 10*3/uL (ref 0.0–0.2)
Basos: 1 %
EOS (ABSOLUTE): 0.2 10*3/uL (ref 0.0–0.4)
Eos: 4 %
Hematocrit: 42.7 % (ref 37.5–51.0)
Hemoglobin: 13.9 g/dL (ref 13.0–17.7)
Immature Grans (Abs): 0 10*3/uL (ref 0.0–0.1)
Immature Granulocytes: 0 %
Lymphocytes Absolute: 1.9 10*3/uL (ref 0.7–3.1)
Lymphs: 33 %
MCH: 30.2 pg (ref 26.6–33.0)
MCHC: 32.6 g/dL (ref 31.5–35.7)
MCV: 93 fL (ref 79–97)
Monocytes Absolute: 0.6 10*3/uL (ref 0.1–0.9)
Monocytes: 10 %
Neutrophils Absolute: 3 10*3/uL (ref 1.4–7.0)
Neutrophils: 52 %
Platelets: 202 10*3/uL (ref 150–450)
RBC: 4.61 x10E6/uL (ref 4.14–5.80)
RDW: 12.5 % (ref 11.6–15.4)
WBC: 5.6 10*3/uL (ref 3.4–10.8)

## 2023-05-26 LAB — LIPID PANEL
Chol/HDL Ratio: 3 ratio (ref 0.0–5.0)
Cholesterol, Total: 128 mg/dL (ref 100–199)
HDL: 42 mg/dL (ref >39)
LDL Chol Calc (NIH): 68 mg/dL (ref 0–99)
Triglycerides: 96 mg/dL (ref 0–149)
VLDL Cholesterol Cal: 18 mg/dL (ref 5–40)

## 2023-07-15 ENCOUNTER — Encounter: Payer: Self-pay | Admitting: Physician Assistant

## 2023-07-15 ENCOUNTER — Ambulatory Visit (INDEPENDENT_AMBULATORY_CARE_PROVIDER_SITE_OTHER): Admitting: Physician Assistant

## 2023-07-15 VITALS — BP 130/78 | HR 74 | Ht 70.0 in | Wt 199.4 lb

## 2023-07-15 DIAGNOSIS — S39012A Strain of muscle, fascia and tendon of lower back, initial encounter: Secondary | ICD-10-CM

## 2023-07-15 MED ORDER — DICLOFENAC SODIUM 1 % EX GEL: 2.0000 g | Freq: Four times a day (QID) | CUTANEOUS | 1 refills | Status: DC

## 2023-07-15 MED ORDER — TIZANIDINE HCL 2 MG PO TABS
2.0000 mg | ORAL_TABLET | Freq: Three times a day (TID) | ORAL | 0 refills | Status: AC | PRN
Start: 2023-07-15 — End: ?

## 2023-07-15 NOTE — Progress Notes (Addendum)
 Date:  07/15/2023   Name:  Kyle Lawrence   DOB:  [birth year]   MRN:  [id number]   Chief Complaint: Back Injury (Patient said he hurt his back moving 1 month ago, the pain comes and goes, sharp pain in one spot on the right lower back )  HPI Kyle Lawrence presents today for evaluation of lower back pain incited by various activities related to a recent move about a month ago.  He has been extremely active as result with various household tasks including lifting boxes, going up and down stairs, caulking, digging holes for the garden and other plants, etc.  Is complaining of intermittent pain and stiffness in the lower back, and says there is an area of the right lower back which "catches" from time to time, particularly if he steps at a certain angle or turns the wrong way.  He does not have any pain when turning over in bed.  He has been using some kind of generic "arthritis medication" with a red label and heating patches.   Medication list has been reviewed and updated.  Current Meds  Medication Sig   albuterol  (VENTOLIN  HFA) 108 (90 Base) MCG/ACT inhaler Inhale 2 puffs into the lungs every 4 (four) hours as needed for wheezing or shortness of breath.   allopurinol  (ZYLOPRIM ) 100 MG tablet Take 1 tablet (100 mg total) by mouth daily.   Ascorbic Acid (VITA-C PO) Take by mouth.   Cholecalciferol (VITAMIN D ) 125 MCG (5000 UT) CAPS Take 5,000 Units by mouth daily.   diclofenac  Sodium (VOLTAREN ) 1 % GEL Apply 2 g topically 4 (four) times daily. Use on affected joint up to 4x/day as needed. 2 grams is roughly 4 fingertips' worth of gel.   finasteride  (PROSCAR ) 5 MG tablet Take 1 tablet (5 mg total) by mouth daily.   rosuvastatin  (CRESTOR ) 5 MG tablet Take 1 tablet (5 mg total) by mouth daily.   tiZANidine  (ZANAFLEX ) 2 MG tablet Take 1 tablet (2 mg total) by mouth every 8 (eight) hours as needed for muscle spasms.     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   Orchalgia 11/20/2013   Hyperlipidemia 11/20/2013   Hydrocele 11/20/2013   Glossitis 11/20/2013   Chronic kidney disease (CKD) stage G3a/A2, moderately decreased glomerular filtration rate (GFR) between 45-59 mL/min/1.73 square meter and albuminuria creatinine ratio between 30-299 mg/g (HCC) 11/20/2013   Gout 11/05/2013   Encysted hydrocele 09/22/2012   Benign prostatic hyperplasia with lower urinary tract symptoms 09/22/2012    Allergies  Allergen Reactions   Colchicine Diarrhea and Nausea Only   Flomax  [Tamsulosin ] Other (See Comments)    Tired and lightheaded, syncope    Immunization History  Administered Date(s) Administered   Fluad Quad(high Dose 65+) 01/26/2023   Influenza Inj Mdck Quad Pf 01/23/2018   PNEUMOCOCCAL CONJUGATE-20 10/25/2022   Pneumococcal Polysaccharide-23 10/17/2018   Tdap 10/26/2022   Zoster Recombinant(Shingrix) 10/26/2022    Past Surgical History:  Procedure Laterality Date   CHOLECYSTECTOMY     COLONOSCOPY WITH PROPOFOL  N/A 05/01/2018   Procedure: COLONOSCOPY WITH PROPOFOL ;  Surgeon: Deveron Fly, MD;  Location: [facility] ENDOSCOPY;  Service: Endoscopy;  Laterality: N/A;   deviated septum repair      Social History   Tobacco Use   Smoking status: Never   Smokeless tobacco: Never  Vaping Use   Vaping status: Never Used  Substance Use Topics   Alcohol use: Yes    Comment: occasional wine   Drug use: Never    Family History  Problem Relation Age of Onset   Diabetes Mother    Kidney disease Father    Diabetes Maternal Uncle         07/15/2023    9:03 AM 05/25/2023    8:03 AM  01/25/2023    8:07 AM 10/25/2022    8:03 AM  GAD 7 : Generalized Anxiety Score  Nervous, Anxious, on Edge 0 0 0 0  Control/stop worrying 0 0 0 0  Worry too much - different things  0 0 0  Trouble relaxing  0 0 0  Restless  0 0 0  Easily annoyed or irritable  0 0 0  Afraid - awful might happen  0 0 0  Total GAD 7 Score  0 0 0  Anxiety Difficulty  Not difficult at all Not difficult at all Not difficult at all       07/15/2023    9:03 AM 05/25/2023    8:03 AM 01/25/2023    8:06 AM  Depression screen PHQ 2/9  Decreased Interest 0 0 0  Down, Depressed, Hopeless 0 0 0  PHQ - 2 Score 0 0 0  Altered sleeping   0  Tired, decreased energy   0  Change in appetite   0  Feeling bad or failure about yourself    0  Trouble concentrating   0  Moving slowly or fidgety/restless   0  Suicidal thoughts   0  PHQ-9 Score   0  Difficult doing work/chores   Not difficult at all    BP Readings from Last 3 Encounters:  07/15/23 130/78  05/25/23 126/86  01/25/23 122/82    Wt Readings from Last 3 Encounters:  07/15/23 199 lb 6 oz (90.4 kg)  05/25/23 205 lb (93 kg)  01/25/23 199 lb (90.3 kg)    BP 130/78   Pulse 74   Ht 5\' 10"  (1.778 m)   Wt 199 lb 6 oz (90.4 kg)   SpO2 98%   BMI 28.61 kg/m   Physical Exam Vitals and nursing note reviewed.  Constitutional:      Appearance: Normal appearance.  Cardiovascular:     Rate and Rhythm: Normal rate.  Pulmonary:     Effort: Pulmonary effort is normal.  Abdominal:     General: There is no distension.  Musculoskeletal:        General: Normal range of motion.     Comments: No midline spinal tenderness.  Patient gestures with his thumb to the right SI joint, although tenderness could not be elicited with palpation alone.  AROM of the spine is mostly preserved, though he does have some limitation to left lateral bending, and has pain with back extension.  Skin:    General: Skin is warm and dry.  Neurological:     Mental Status: He is alert  and oriented to person, place, and time.     Gait: Gait is intact.  Psychiatric:        Mood and Affect: Mood and affect normal.     Recent Labs     Component Value Date/Time   NA 142 05/25/2023  0859   K 4.3 05/25/2023 0859   CL 109 (H) 05/25/2023 0859   CO2 19 (L) 05/25/2023 0859   GLUCOSE 91 05/25/2023 0859   BUN 26 05/25/2023 0859   CREATININE 1.33 (H) 05/25/2023 0859   CALCIUM  9.7 05/25/2023 0859   PROT 6.7 05/25/2023 0859   ALBUMIN 4.4 05/25/2023 0859   AST 19 05/25/2023 0859   ALT 13 05/25/2023 0859   ALKPHOS 72 05/25/2023 0859   BILITOT 0.5 05/25/2023 0859    Lab Results  Component Value Date   WBC 5.6 05/25/2023   HGB 13.9 05/25/2023   HCT 42.7 05/25/2023   MCV 93 05/25/2023   PLT 202 05/25/2023   No results found for: "HGBA1C" Lab Results  Component Value Date   CHOL 128 05/25/2023   HDL 42 05/25/2023   LDLCALC 68 05/25/2023   TRIG 96 05/25/2023   CHOLHDL 3.0 05/25/2023   No results found for: "TSH"   Assessment and Plan:  1. Low back strain, initial encounter (Primary) Patient reassured this is likely from overuse with recent move.  May be some degree of sacroiliitis.  No need for radiography today.    Advised relative rest at least for the next 1 to 2 days.  He was advised to double check his generic "arthritis medication" to make sure this is not an oral NSAID given his CKD.    Prescribing topical diclofenac  for application up to 4 times per day at the site of pain, in addition to a low-dose of tizanidine  which he was advised may make him drowsy so his first dose should be tonight with dinner to gauge effect.  He should not use during the day if this causes drowsiness.  Patient in agreement with plan and will give me an update via MyChart in 1 to 2 weeks.  If no better, plan to refer to PT.  - diclofenac  Sodium (VOLTAREN ) 1 % GEL; Apply 2 g topically 4 (four) times daily. Use on affected joint up to 4x/day as needed. 2 grams is roughly 4 fingertips'  worth of gel.  Dispense: 100 g; Refill: 1 - tiZANidine  (ZANAFLEX ) 2 MG tablet; Take 1 tablet (2 mg total) by mouth every 8 (eight) hours as needed for muscle spasms.  Dispense: 30 tablet; Refill: 0    Return if symptoms worsen or fail to improve.    Cody Das, PA-C, DMSc, Nutritionist [facility] Primary Care and Sports Medicine [facility] [phone]

## 2023-08-01 ENCOUNTER — Other Ambulatory Visit: Payer: Self-pay | Admitting: Physician Assistant

## 2023-08-01 DIAGNOSIS — S39012S Strain of muscle, fascia and tendon of lower back, sequela: Secondary | ICD-10-CM

## 2023-08-01 NOTE — Telephone Encounter (Signed)
 Appt?  KP

## 2023-08-16 DIAGNOSIS — R293 Abnormal posture: Secondary | ICD-10-CM | POA: Diagnosis not present

## 2023-08-16 DIAGNOSIS — M5459 Other low back pain: Secondary | ICD-10-CM | POA: Diagnosis not present

## 2023-08-19 DIAGNOSIS — M5459 Other low back pain: Secondary | ICD-10-CM | POA: Diagnosis not present

## 2023-08-19 DIAGNOSIS — R293 Abnormal posture: Secondary | ICD-10-CM | POA: Diagnosis not present

## 2023-08-24 DIAGNOSIS — R293 Abnormal posture: Secondary | ICD-10-CM | POA: Diagnosis not present

## 2023-08-24 DIAGNOSIS — M5459 Other low back pain: Secondary | ICD-10-CM | POA: Diagnosis not present

## 2023-08-26 DIAGNOSIS — M5459 Other low back pain: Secondary | ICD-10-CM | POA: Diagnosis not present

## 2023-08-26 DIAGNOSIS — R293 Abnormal posture: Secondary | ICD-10-CM | POA: Diagnosis not present

## 2023-08-31 DIAGNOSIS — M5459 Other low back pain: Secondary | ICD-10-CM | POA: Diagnosis not present

## 2023-08-31 DIAGNOSIS — R293 Abnormal posture: Secondary | ICD-10-CM | POA: Diagnosis not present

## 2023-09-02 DIAGNOSIS — R293 Abnormal posture: Secondary | ICD-10-CM | POA: Diagnosis not present

## 2023-09-02 DIAGNOSIS — M5459 Other low back pain: Secondary | ICD-10-CM | POA: Diagnosis not present

## 2023-09-07 ENCOUNTER — Ambulatory Visit (INDEPENDENT_AMBULATORY_CARE_PROVIDER_SITE_OTHER): Admitting: Emergency Medicine

## 2023-09-07 VITALS — Ht 70.0 in | Wt 199.0 lb

## 2023-09-07 DIAGNOSIS — Z0000 Encounter for general adult medical examination without abnormal findings: Secondary | ICD-10-CM

## 2023-09-07 DIAGNOSIS — R293 Abnormal posture: Secondary | ICD-10-CM | POA: Diagnosis not present

## 2023-09-07 DIAGNOSIS — M5459 Other low back pain: Secondary | ICD-10-CM | POA: Diagnosis not present

## 2023-09-07 DIAGNOSIS — Z1211 Encounter for screening for malignant neoplasm of colon: Secondary | ICD-10-CM

## 2023-09-07 NOTE — Progress Notes (Signed)
 Subjective:   Kyle Lawrence is a 78 y.o. who presents for a Medicare Wellness preventive visit.  As a reminder, Annual Wellness Visits don't include a physical exam, and some assessments may be limited, especially if this visit is performed virtually. We may recommend an in-person follow-up visit with your provider if needed.  Visit Complete: Virtual I connected with  Kyle Lawrence on 09/07/23 by a audio enabled telemedicine application and verified that I am speaking with the correct person using two identifiers.  Patient Location: Home  Provider Location: Home Office  I discussed the limitations of evaluation and management by telemedicine. The patient expressed understanding and agreed to proceed.  Vital Signs: Because this visit was a virtual/telehealth visit, some criteria may be missing or patient reported. Any vitals not documented were not able to be obtained and vitals that have been documented are patient reported.  VideoDeclined- This patient declined Librarian, academic. Therefore the visit was completed with audio only.  Persons Participating in Visit: Patient.  AWV Questionnaire: No: Patient Medicare AWV questionnaire was not completed prior to this visit.  Cardiac Risk Factors include: advanced age (>76men, >40 women);male gender;dyslipidemia     Objective:    Today's Vitals   09/07/23 0807 09/07/23 0808  Weight: 199 lb (90.3 kg)   Height: 5' 10 (1.778 m)   PainSc:  1    Body mass index is 28.55 kg/m.     09/07/2023    8:20 AM 08/18/2022    8:20 AM 05/01/2018    1:27 PM  Advanced Directives  Does Patient Have a Medical Advance Directive? No No No   Would patient like information on creating a medical advance directive? Yes (MAU/Ambulatory/Procedural Areas - Information given)  No - Patient declined      Data saved with a previous flowsheet row definition    Current Medications (verified) Outpatient Encounter Medications as  of 09/07/2023  Medication Sig   albuterol  (VENTOLIN  HFA) 108 (90 Base) MCG/ACT inhaler Inhale 2 puffs into the lungs every 4 (four) hours as needed for wheezing or shortness of breath.   allopurinol  (ZYLOPRIM ) 100 MG tablet Take 1 tablet (100 mg total) by mouth daily.   Ascorbic Acid (VITA-C PO) Take by mouth.   Cholecalciferol (VITAMIN D ) 125 MCG (5000 UT) CAPS Take 5,000 Units by mouth daily.   finasteride  (PROSCAR ) 5 MG tablet Take 1 tablet (5 mg total) by mouth daily.   rosuvastatin  (CRESTOR ) 5 MG tablet Take 1 tablet (5 mg total) by mouth daily.   diclofenac  Sodium (VOLTAREN ) 1 % GEL Apply 2 g topically 4 (four) times daily. Use on affected joint up to 4x/day as needed. 2 grams is roughly 4 fingertips' worth of gel. (Patient not taking: Reported on 09/07/2023)   tiZANidine  (ZANAFLEX ) 2 MG tablet Take 1 tablet (2 mg total) by mouth every 8 (eight) hours as needed for muscle spasms. (Patient not taking: Reported on 09/07/2023)   No facility-administered encounter medications on file as of 09/07/2023.    Allergies (verified) Colchicine and Flomax  [tamsulosin ]   History: Past Medical History:  Diagnosis Date   Arthritis    Asthma    asthmatic bronchitis   Chronic renal insufficiency    Congenital single kidney    Glossitis    Gout    Hydrocele    Hyperlipidemia    Past Surgical History:  Procedure Laterality Date   CHOLECYSTECTOMY     COLONOSCOPY WITH PROPOFOL  N/A 05/01/2018   Procedure: COLONOSCOPY WITH  PROPOFOL ;  Surgeon: Gaylyn Gladis PENNER, MD;  Location: [facility] ENDOSCOPY;  Service: Endoscopy;  Laterality: N/A;   deviated septum repair     Family History  Problem Relation Age of Onset   Diabetes Mother    Kidney disease Father    Diabetes Maternal Uncle    Social History   Socioeconomic History   Marital status: Married    Spouse name: Olam   Number of children: 2   Years of education: Not on file   Highest education level: Not on file  Occupational History    Occupation: retired  Tobacco Use   Smoking status: Never   Smokeless tobacco: Never  Vaping Use   Vaping status: Never Used  Substance and Sexual Activity   Alcohol use: Yes    Comment: 1 glass of wine 2-3 times per month   Drug use: Never   Sexual activity: Yes    Birth control/protection: None  Other Topics Concern   Not on file  Social History Narrative   Not on file   Social Drivers of Health   Financial Resource Strain: Low Risk  (09/07/2023)   Overall Financial Resource Strain (CARDIA)    Difficulty of Paying Living Expenses: Not hard at all  Food Insecurity: No Food Insecurity (09/07/2023)   Hunger Vital Sign    Worried About Running Out of Food in the Last Year: Never true    Ran Out of Food in the Last Year: Never true  Transportation Needs: No Transportation Needs (09/07/2023)   PRAPARE - Administrator, Civil Service (Medical): No    Lack of Transportation (Non-Medical): No  Physical Activity: Sufficiently Active (09/07/2023)   Exercise Vital Sign    Days of Exercise per Week: 7 days    Minutes of Exercise per Session: 60 min  Stress: No Stress Concern Present (09/07/2023)   [employer] of Occupational Health - Occupational Stress Questionnaire    Feeling of Stress: Not at all  Social Connections: Moderately Isolated (09/07/2023)   Social Connection and Isolation Panel    Frequency of Communication with Friends and Family: More than three times a week    Frequency of Social Gatherings with Friends and Family: More than three times a week    Attends Religious Services: Never    Database administrator or Organizations: No    Attends Engineer, structural: Never    Marital Status: Married    Tobacco Counseling Counseling given: Not Answered    Clinical Intake:  Pre-visit preparation completed: Yes  Pain : 0-10 Pain Score: 1  Pain Type: Chronic pain Pain Location: Back Pain Descriptors / Indicators: Aching     BMI - recorded:  28.55 Nutritional Status: BMI 25 -29 Overweight Nutritional Risks: None Diabetes: No  No results found for: HGBA1C   How often do you need to have someone help you when you read instructions, pamphlets, or other written materials from your doctor or pharmacy?: 1 - Never  Interpreter Needed?: No  Information entered by :: Vina Ned, CMA   Activities of Daily Living     09/07/2023    8:10 AM  In your present state of health, do you have any difficulty performing the following activities:  Hearing? 0  Vision? 0  Difficulty concentrating or making decisions? 0  Walking or climbing stairs? 0  Dressing or bathing? 0  Doing errands, shopping? 0  Preparing Food and eating ? N  Using the Toilet? N  In the past six  months, have you accidently leaked urine? N  Do you have problems with loss of bowel control? N  Managing your Medications? N  Managing your Finances? N  Housekeeping or managing your Housekeeping? N    Patient Care Team: Manya Toribio SQUIBB, PA as PCP - General (Physician Assistant) Carolee Manus DASEN., MD (Ophthalmology)  I have updated your Care Teams any recent Medical Services you may have received from other providers in the past year.     Assessment:   This is a routine wellness examination for Seith.  Hearing/Vision screen Hearing Screening - Comments:: Denies hearing loss Vision Screening - Comments:: Gets routine eye exams, Dr. Manus Carolee, [place]   Goals Addressed               This Visit's Progress     Weight (lb) < 190 lb (86.2 kg) (pt-stated)   199 lb (90.3 kg)      Depression Screen     09/07/2023    8:17 AM 07/15/2023    9:03 AM 05/25/2023    8:03 AM 01/25/2023    8:06 AM 10/25/2022    8:02 AM 08/18/2022    8:09 AM 08/03/2022    8:52 AM  PHQ 2/9 Scores  PHQ - 2 Score 0 0 0 0 0 0 0  PHQ- 9 Score 0   0 0 0 0    Fall Risk     09/07/2023    8:22 AM 07/15/2023    9:03 AM 05/25/2023    8:03 AM 01/25/2023    8:07 AM 10/25/2022     8:03 AM  Fall Risk   Falls in the past year? 0 0 0 1 1  Number falls in past yr: 0 0 0 0 0  Injury with Fall? 0 0 0 0 0  Risk for fall due to : No Fall Risks No Fall Risks No Fall Risks No Fall Risks History of fall(s)  Follow up Falls evaluation completed Falls evaluation completed Falls evaluation completed Falls evaluation completed Falls evaluation completed    MEDICARE RISK AT HOME:  Medicare Risk at Home Any stairs in or around the home?: Yes If so, are there any without handrails?: No Home free of loose throw rugs in walkways, pet beds, electrical cords, etc?: Yes Adequate lighting in your home to reduce risk of falls?: Yes Life alert?: No Use of a cane, walker or w/c?: No Grab bars in the bathroom?: No Shower chair or bench in shower?: No Elevated toilet seat or a handicapped toilet?: No  TIMED UP AND GO:  Was the test performed?  No  Cognitive Function: 6CIT completed        09/07/2023    8:24 AM 08/18/2022    8:21 AM  6CIT Screen  What Year? 0 points 0 points  What month? 0 points 0 points  What time? 0 points 0 points  Count back from 20 0 points 0 points  Months in reverse 0 points 0 points  Repeat phrase 0 points 2 points  Total Score 0 points 2 points    Immunizations Immunization History  Administered Date(s) Administered   Fluad Quad(high Dose 65+) 01/26/2023   Influenza Inj Mdck Quad Pf 01/23/2018   PNEUMOCOCCAL CONJUGATE-20 10/25/2022   Pneumococcal Polysaccharide-23 10/17/2018   Tdap 10/26/2022   Zoster Recombinant(Shingrix) 10/26/2022    Screening Tests Health Maintenance  Topic Date Due   COVID-19 Vaccine (1) Never done   Zoster Vaccines- Shingrix (2 of 2) 12/21/2022   Colonoscopy  05/02/2023   INFLUENZA VACCINE  10/14/2023   Medicare Annual Wellness (AWV)  09/06/2024   DTaP/Tdap/Td (2 - Td or Tdap) 10/25/2032   Pneumococcal Vaccine: 50+ Years  Completed   Hepatitis C Screening  Completed   Hepatitis B Vaccines  Aged Out   HPV  VACCINES  Aged Out   Meningococcal B Vaccine  Aged Out    Health Maintenance  Health Maintenance Due  Topic Date Due   COVID-19 Vaccine (1) Never done   Zoster Vaccines- Shingrix (2 of 2) 12/21/2022   Colonoscopy  05/02/2023   Health Maintenance Items Addressed: Referral sent to GI for colonoscopy, See Nurse Notes at the end of this note  Additional Screening:  Vision Screening: Recommended annual ophthalmology exams for early detection of glaucoma and other disorders of the eye. Would you like a referral to an eye doctor? No    Dental Screening: Recommended annual dental exams for proper oral hygiene  Community Resource Referral / Chronic Care Management: CRR required this visit?  No   CCM required this visit?  No   Plan:    I have personally reviewed and noted the following in the patient's chart:   Medical and social history Use of alcohol, tobacco or illicit drugs  Current medications and supplements including opioid prescriptions. Patient is not currently taking opioid prescriptions. Functional ability and status Nutritional status Physical activity Advanced directives List of other physicians Hospitalizations, surgeries, and ER visits in previous 12 months Vitals Screenings to include cognitive, depression, and falls Referrals and appointments  In addition, I have reviewed and discussed with patient certain preventive protocols, quality metrics, and best practice recommendations. A written personalized care plan for preventive services as well as general preventive health recommendations were provided to patient.   Vina Ned, CMA   09/07/2023   After Visit Summary: (MyChart) Due to this being a telephonic visit, the after visit summary with patients personalized plan was offered to patient via MyChart   Notes:  Placed referral to GI for colonoscopy (last 05/01/18 with 5 yr recall) Needs 2nd Shingrix vaccine (pharmacy) Declined Covid vaccines

## 2023-09-07 NOTE — Patient Instructions (Signed)
 Kyle Lawrence , Thank you for taking time out of your busy schedule to complete your Annual Wellness Visit with me. I enjoyed our conversation and look forward to speaking with you again next year. I, as well as your care team,  appreciate your ongoing commitment to your health goals. Please review the following plan we discussed and let me know if I can assist you in the future. Your Game plan/ To Do List    Referrals: I have placed a referral to [facility] (ph# [phone]) for a colonoscopy, if you haven't heard from the office you've been referred to, please reach out to them at the phone provided.   Follow up Visits: Next Medicare AWV with our clinical staff: 09/12/24 @ 8:10am (VIDEO VISIT)   Have you seen your provider in the last 6 months (3 months if uncontrolled diabetes)? Yes Next Office Visit with your provider: 09/27/23 @ 8:00am with Toribio Hoyle, PA  Clinician Recommendations: Get the 2nd Shingrix (shingles) vaccine at your convenience.  Aim for 30 minutes of exercise or brisk walking, 6-8 glasses of water, and 5 servings of fruits and vegetables each day.       This is a list of the screening recommended for you and due dates:  Health Maintenance  Topic Date Due   COVID-19 Vaccine (1) Never done   Zoster (Shingles) Vaccine (2 of 2) 12/21/2022   Colon Cancer Screening  05/02/2023   Flu Shot  10/14/2023   Medicare Annual Wellness Visit  09/06/2024   DTaP/Tdap/Td vaccine (2 - Td or Tdap) 10/25/2032   Pneumococcal Vaccine for age over 12  Completed   Hepatitis C Screening  Completed   Hepatitis B Vaccine  Aged Out   HPV Vaccine  Aged Out   Meningitis B Vaccine  Aged Out    Advanced directives: (ACP Link)Information on Advanced Care Planning can be found at [employer] Advance Health Care Directives Advance Health Care Directives. http://guzman.com/ You may also get the forms at your doctor's office. Advance Care Planning is important because it:  [x]  Makes  sure you receive the medical care that is consistent with your values, goals, and preferences  [x]  It provides guidance to your family and loved ones and reduces their decisional burden about whether or not they are making the right decisions based on your wishes.  Follow the link provided in your after visit summary or read over the paperwork we have mailed to you to help you started getting your Advance Directives in place. If you need assistance in completing these, please reach out to us  so that we can help you!  See attachments for Preventive Care and Fall Prevention Tips.   Fall Prevention in the Home, Adult Falls can cause injuries and affect people of all ages. There are many simple things that you can do to make your home safe and to help prevent falls. If you need it, ask for help making these changes. What actions can I take to prevent falls? General information Use good lighting in all rooms. Make sure to: Replace any light bulbs that burn out. Turn on lights if it is dark and use night-lights. Keep items that you use often in easy-to-reach places. Lower the shelves around your home if needed. Move furniture so that there are clear paths around it. Do not keep throw rugs or other things on the floor that can make you trip. If any of your floors are uneven, fix them. Add color  or contrast paint or tape to clearly mark and help you see: Grab bars or handrails. First and last steps of staircases. Where the edge of each step is. If you use a ladder or stepladder: Make sure that it is fully opened. Do not climb a closed ladder. Make sure the sides of the ladder are locked in place. Have someone hold the ladder while you use it. Know where your pets are as you move through your home. What can I do in the bathroom?     Keep the floor dry. Clean up any water that is on the floor right away. Remove soap buildup in the bathtub or shower. Buildup makes bathtubs and showers  slippery. Use non-skid mats or decals on the floor of the bathtub or shower. Attach bath mats securely with double-sided, non-slip rug tape. If you need to sit down while you are in the shower, use a non-slip stool. Install grab bars by the toilet and in the bathtub and shower. Do not use towel bars as grab bars. What can I do in the bedroom? Make sure that you have a light by your bed that is easy to reach. Do not use any sheets or blankets on your bed that hang to the floor. Have a firm bench or chair with side arms that you can use for support when you get dressed. What can I do in the kitchen? Clean up any spills right away. If you need to reach something above you, use a sturdy step stool that has a grab bar. Keep electrical cables out of the way. Do not use floor polish or wax that makes floors slippery. What can I do with my stairs? Do not leave anything on the stairs. Make sure that you have a light switch at the top and the bottom of the stairs. Have them installed if you do not have them. Make sure that there are handrails on both sides of the stairs. Fix handrails that are broken or loose. Make sure that handrails are as long as the staircases. Install non-slip stair treads on all stairs in your home if they do not have carpet. Avoid having throw rugs at the top or bottom of stairs, or secure the rugs with carpet tape to prevent them from moving. Choose a carpet design that does not hide the edge of steps on the stairs. Make sure that carpet is firmly attached to the stairs. Fix any carpet that is loose or worn. What can I do on the outside of my home? Use bright outdoor lighting. Repair the edges of walkways and driveways and fix any cracks. Clear paths of anything that can make you trip, such as tools or rocks. Add color or contrast paint or tape to clearly mark and help you see high doorway thresholds. Trim any bushes or trees on the main path into your home. Check that  handrails are securely fastened and in good repair. Both sides of all steps should have handrails. Install guardrails along the edges of any raised decks or porches. Have leaves, snow, and ice cleared regularly. Use sand, salt, or ice melt on walkways during winter months if you live where there is ice and snow. In the garage, clean up any spills right away, including grease or oil spills. What other actions can I take? Review your medicines with your health care provider. Some medicines can make you confused or feel dizzy. This can increase your chance of falling. Wear closed-toe shoes that fit well  and support your feet. Wear shoes that have rubber soles and low heels. Use a cane, walker, scooter, or crutches that help you move around if needed. Talk with your provider about other ways that you can decrease your risk of falls. This may include seeing a physical therapist to learn to do exercises to improve movement and strength. Where to find more information Centers for Disease Control and Prevention, [employer]: TonerPromos.no [employer] on Aging: BaseRingTones.pl National Institute on Aging: BaseRingTones.pl Contact a health care provider if: You are afraid of falling at home. You feel weak, drowsy, or dizzy at home. You fall at home. Get help right away if you: Lose consciousness or have trouble moving after a fall. Have a fall that causes a head injury. These symptoms may be an emergency. Get help right away. Call 911. Do not wait to see if the symptoms will go away. Do not drive yourself to the hospital. This information is not intended to replace advice given to you by your health care provider. Make sure you discuss any questions you have with your health care provider. Document Revised: 11/02/2021 Document Reviewed: 11/02/2021 Elsevier Patient Education  2024 [employer].

## 2023-09-09 DIAGNOSIS — R293 Abnormal posture: Secondary | ICD-10-CM | POA: Diagnosis not present

## 2023-09-09 DIAGNOSIS — M5459 Other low back pain: Secondary | ICD-10-CM | POA: Diagnosis not present

## 2023-09-13 DIAGNOSIS — R293 Abnormal posture: Secondary | ICD-10-CM | POA: Diagnosis not present

## 2023-09-13 DIAGNOSIS — M5459 Other low back pain: Secondary | ICD-10-CM | POA: Diagnosis not present

## 2023-09-15 DIAGNOSIS — M5459 Other low back pain: Secondary | ICD-10-CM | POA: Diagnosis not present

## 2023-09-15 DIAGNOSIS — R293 Abnormal posture: Secondary | ICD-10-CM | POA: Diagnosis not present

## 2023-09-27 ENCOUNTER — Ambulatory Visit (INDEPENDENT_AMBULATORY_CARE_PROVIDER_SITE_OTHER): Admitting: Physician Assistant

## 2023-09-27 ENCOUNTER — Ambulatory Visit
Admission: RE | Admit: 2023-09-27 | Discharge: 2023-09-27 | Disposition: A | Discharge status: Home / Self Care | Attending: Physician Assistant | Admitting: Physician Assistant

## 2023-09-27 ENCOUNTER — Ambulatory Visit
Admission: RE | Admit: 2023-09-27 | Discharge: 2023-09-27 | Disposition: A | Discharge status: Home / Self Care | Source: Ambulatory Visit | Attending: Physician Assistant | Admitting: Physician Assistant

## 2023-09-27 ENCOUNTER — Encounter: Payer: Self-pay | Admitting: Physician Assistant

## 2023-09-27 VITALS — BP 122/80 | HR 77 | Ht 70.0 in | Wt 193.0 lb

## 2023-09-27 DIAGNOSIS — M8580 Other specified disorders of bone density and structure, unspecified site: Secondary | ICD-10-CM | POA: Diagnosis not present

## 2023-09-27 DIAGNOSIS — M47816 Spondylosis without myelopathy or radiculopathy, lumbar region: Secondary | ICD-10-CM | POA: Diagnosis not present

## 2023-09-27 DIAGNOSIS — Z860101 Personal history of adenomatous and serrated colon polyps: Secondary | ICD-10-CM

## 2023-09-27 DIAGNOSIS — M545 Low back pain, unspecified: Secondary | ICD-10-CM

## 2023-09-27 DIAGNOSIS — E782 Mixed hyperlipidemia: Secondary | ICD-10-CM | POA: Diagnosis not present

## 2023-09-27 DIAGNOSIS — N1831 Chronic kidney disease, stage 3a: Secondary | ICD-10-CM | POA: Diagnosis not present

## 2023-09-27 DIAGNOSIS — M48061 Spinal stenosis, lumbar region without neurogenic claudication: Secondary | ICD-10-CM | POA: Diagnosis not present

## 2023-09-27 NOTE — Progress Notes (Signed)
 Date:  09/27/2023   Name:  Kyle Lawrence   DOB:  Dec 16, 1945   MRN:  [id number]   Chief Complaint: Medical Management of Chronic Issues  HPI Kyle Lawrence returns to clinic today for 61-month follow-up on chronic conditions, namely CKD 3a and HLD.  He has had no gout flares since starting low-dose allopurinol  100 mg daily.    Feels well overall aside from lower back pain. He has been taking rosuvastatin  as needed every 2-4 days.   Had great success with PT a few months ago for his right lower back pain, however he skipped his home exercises for a few days and now reports lower central back pain. Says it started while golfing ~2 weeks ago, but due to his persistent back issues, he is requesting lumbar xrays today.   Has not scheduled his repeat colonoscopy yet. Does not want to go back to [facility] because they kicked my wife out.   Medication list has been reviewed and updated.  Current Meds  Medication Sig   albuterol  (VENTOLIN  HFA) 108 (90 Base) MCG/ACT inhaler Inhale 2 puffs into the lungs every 4 (four) hours as needed for wheezing or shortness of breath.   allopurinol  (ZYLOPRIM ) 100 MG tablet Take 1 tablet (100 mg total) by mouth daily.   Ascorbic Acid (VITA-C PO) Take by mouth.   Cholecalciferol (VITAMIN D ) 125 MCG (5000 UT) CAPS Take 5,000 Units by mouth daily.   finasteride  (PROSCAR ) 5 MG tablet Take 1 tablet (5 mg total) by mouth daily.   rosuvastatin  (CRESTOR ) 5 MG tablet Take 1 tablet (5 mg total) by mouth daily. (Patient taking differently: Take 5 mg by mouth as needed.)   tiZANidine  (ZANAFLEX ) 2 MG tablet Take 1 tablet (2 mg total) by mouth every 8 (eight) hours as needed for muscle spasms.     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   Orchalgia 11/20/2013   Hyperlipidemia 11/20/2013   Hydrocele 11/20/2013   Glossitis 11/20/2013   Chronic kidney disease (CKD) stage G3a/A2, moderately decreased glomerular filtration rate (GFR) between 45-59 mL/min/1.73 square meter and albuminuria creatinine ratio between 30-299 mg/g (HCC) 11/20/2013   Gout 11/05/2013   Encysted hydrocele 09/22/2012   Benign prostatic hyperplasia with lower urinary tract symptoms 09/22/2012    Allergies  Allergen Reactions   Colchicine Diarrhea and Nausea Only   Flomax  [Tamsulosin ] Other (See Comments)    Tired and lightheaded, syncope    Immunization History  Administered Date(s) Administered   Fluad Quad(high Dose 65+) 01/26/2023   Influenza Inj Mdck Quad Pf 01/23/2018   PNEUMOCOCCAL CONJUGATE-20 10/25/2022   Pneumococcal Polysaccharide-23 10/17/2018   Tdap 10/26/2022   Zoster Recombinant(Shingrix) 10/26/2022    Past Surgical History:  Procedure Laterality Date   CHOLECYSTECTOMY     COLONOSCOPY WITH PROPOFOL  N/A 05/01/2018   Procedure: COLONOSCOPY WITH PROPOFOL ;  Surgeon: Gaylyn Gladis PENNER, MD;  Location: [facility]COPY;  Service: Endoscopy;  Laterality: N/A;   deviated septum repair      Social History   Tobacco Use   Smoking status: Never   Smokeless tobacco: Never  Vaping Use   Vaping status: Never Used  Substance Use Topics  Alcohol use: Yes    Comment: 1 glass of wine 2-3 times per month   Drug use: Never    Family History  Problem Relation Age of Onset   Diabetes Mother    Kidney disease Father    Diabetes Maternal Uncle         09/27/2023    8:05 AM 07/15/2023    9:03 AM 05/25/2023    8:03 AM 01/25/2023    8:07 AM  GAD 7 : Generalized Anxiety Score  Nervous, Anxious, on Edge 0 0 0 0  Control/stop worrying 0 0 0 0  Worry too much - different things 0  0 0   Trouble relaxing 0  0 0  Restless 0  0 0  Easily annoyed or irritable 0  0 0  Afraid - awful might happen 0  0 0  Total GAD 7 Score 0  0 0  Anxiety Difficulty Not difficult at all  Not difficult at all Not difficult at all       09/27/2023    8:05 AM 09/07/2023    8:17 AM 07/15/2023    9:03 AM  Depression screen PHQ 2/9  Decreased Interest 0 0 0  Down, Depressed, Hopeless 0 0 0  PHQ - 2 Score 0 0 0  Altered sleeping  0   Tired, decreased energy  0   Change in appetite  0   Feeling bad or failure about yourself   0   Trouble concentrating  0   Moving slowly or fidgety/restless  0   Suicidal thoughts  0   PHQ-9 Score  0   Difficult doing work/chores  Not difficult at all     BP Readings from Last 3 Encounters:  09/27/23 122/80  07/15/23 130/78  05/25/23 126/86    Wt Readings from Last 3 Encounters:  09/27/23 193 lb (87.5 kg)  09/07/23 199 lb (90.3 kg)  07/15/23 199 lb 6 oz (90.4 kg)    BP 122/80   Pulse 77   Ht 5' 10 (1.778 m)   Wt 193 lb (87.5 kg)   SpO2 96%   BMI 27.69 kg/m   Physical Exam Vitals and nursing note reviewed.  Constitutional:      Appearance: Normal appearance.  Cardiovascular:     Rate and Rhythm: Normal rate.  Pulmonary:     Effort: Pulmonary effort is normal.  Abdominal:     General: There is no distension.     Tenderness: There is no right CVA tenderness or left CVA tenderness.  Musculoskeletal:        General: Normal range of motion.     Comments: Tenderness over midline lumbosacral spine but not adjacent musculature.   Skin:    General: Skin is warm and dry.  Neurological:     Mental Status: He is alert and oriented to person, place, and time.     Gait: Gait is intact.  Psychiatric:        Mood and Affect: Mood and affect normal.     Recent Labs     Component Value Date/Time   NA 142 05/25/2023 0859   K 4.3 05/25/2023 0859   CL 109 (H) 05/25/2023 0859   CO2 19 (L) 05/25/2023 0859   GLUCOSE 91 05/25/2023 0859   BUN 26  05/25/2023 0859   CREATININE 1.33 (H) 05/25/2023 0859   CALCIUM  9.7 05/25/2023 0859   PROT 6.7 05/25/2023 0859   ALBUMIN 4.4 05/25/2023 0859   AST 19 05/25/2023 0859  ALT 13 05/25/2023 0859   ALKPHOS 72 05/25/2023 0859   BILITOT 0.5 05/25/2023 0859    Lab Results  Component Value Date   WBC 5.6 05/25/2023   HGB 13.9 05/25/2023   HCT 42.7 05/25/2023   MCV 93 05/25/2023   PLT 202 05/25/2023   No results found for: HGBA1C Lab Results  Component Value Date   CHOL 128 05/25/2023   HDL 42 05/25/2023   LDLCALC 68 05/25/2023   TRIG 96 05/25/2023   CHOLHDL 3.0 05/25/2023   No results found for: TSH   Assessment and Plan:  Chronic kidney disease (CKD) stage G3a/A2, moderately decreased glomerular filtration rate (GFR) between 45-59 mL/min/1.73 square meter and albuminuria creatinine ratio between 30-299 mg/g (HCC) Assessment & Plan: Repeat BMP today  Orders: -     Basic metabolic panel with GFR -     Microalbumin / creatinine urine ratio  Mixed hyperlipidemia Assessment & Plan: Encouraged patient to take daily. If any issues/SE he can try every other day but would be best taken daily.    History of adenomatous polyp of colon Assessment & Plan: Submitting new referral for repeat colonoscopy  Orders: -     Ambulatory referral to Gastroenterology  Lumbar back pain -     DG Lumbar Spine Complete; Future     Return in about 4 months (around 01/28/2024) for CPE.    Rolan Hoyle, PA-C, DMSc, Nutritionist [facility] [phone]

## 2023-09-27 NOTE — Assessment & Plan Note (Signed)
 Submitting new referral for repeat colonoscopy

## 2023-09-27 NOTE — Assessment & Plan Note (Signed)
 Encouraged patient to take daily. If any issues/SE he can try every other day but would be best taken daily.

## 2023-09-27 NOTE — Assessment & Plan Note (Signed)
 Repeat BMP today.

## 2023-09-29 ENCOUNTER — Ambulatory Visit: Payer: Self-pay | Admitting: Physician Assistant

## 2023-09-29 DIAGNOSIS — M8580 Other specified disorders of bone density and structure, unspecified site: Secondary | ICD-10-CM | POA: Insufficient documentation

## 2023-09-29 DIAGNOSIS — M539 Dorsopathy, unspecified: Secondary | ICD-10-CM | POA: Insufficient documentation

## 2023-09-29 LAB — BASIC METABOLIC PANEL WITH GFR
BUN/Creatinine Ratio: 14 (ref 10–24)
BUN: 27 mg/dL (ref 8–27)
CO2: 20 mmol/L (ref 20–29)
Calcium: 10 mg/dL (ref 8.6–10.2)
Chloride: 102 mmol/L (ref 96–106)
Creatinine, Ser: 1.99 mg/dL — ABNORMAL HIGH (ref 0.76–1.27)
Glucose: 102 mg/dL — ABNORMAL HIGH (ref 70–99)
Potassium: 4.6 mmol/L (ref 3.5–5.2)
Sodium: 139 mmol/L (ref 134–144)
eGFR: 34 mL/min/1.73 — ABNORMAL LOW (ref >59)

## 2023-09-29 LAB — MICROALBUMIN / CREATININE URINE RATIO
Creatinine, Urine: 215.1 mg/dL
Microalb/Creat Ratio: 75 mg/g{creat} — ABNORMAL HIGH (ref 0–29)
Microalbumin, Urine: 162.3 ug/mL

## 2023-10-05 ENCOUNTER — Other Ambulatory Visit: Payer: Self-pay

## 2023-10-05 DIAGNOSIS — N1831 Chronic kidney disease, stage 3a: Secondary | ICD-10-CM

## 2023-10-05 NOTE — Telephone Encounter (Signed)
 Please review.  KP

## 2023-10-10 ENCOUNTER — Other Ambulatory Visit: Payer: Self-pay | Admitting: Physician Assistant

## 2023-10-10 DIAGNOSIS — N1831 Chronic kidney disease, stage 3a: Secondary | ICD-10-CM

## 2023-10-11 DIAGNOSIS — N1831 Chronic kidney disease, stage 3a: Secondary | ICD-10-CM | POA: Diagnosis not present

## 2023-10-12 ENCOUNTER — Ambulatory Visit: Payer: Self-pay | Admitting: Physician Assistant

## 2023-10-12 LAB — BASIC METABOLIC PANEL WITH GFR
BUN/Creatinine Ratio: 11 (ref 10–24)
BUN: 14 mg/dL (ref 8–27)
CO2: 23 mmol/L (ref 20–29)
Calcium: 10.2 mg/dL (ref 8.6–10.2)
Chloride: 101 mmol/L (ref 96–106)
Creatinine, Ser: 1.22 mg/dL (ref 0.76–1.27)
Glucose: 94 mg/dL (ref 70–99)
Potassium: 4.9 mmol/L (ref 3.5–5.2)
Sodium: 138 mmol/L (ref 134–144)
eGFR: 61 mL/min/1.73 (ref >59)

## 2023-10-12 NOTE — Telephone Encounter (Signed)
 Please review.  KP

## 2023-10-22 ENCOUNTER — Other Ambulatory Visit: Payer: Self-pay | Admitting: Physician Assistant

## 2023-10-22 DIAGNOSIS — N401 Enlarged prostate with lower urinary tract symptoms: Secondary | ICD-10-CM

## 2023-10-24 ENCOUNTER — Other Ambulatory Visit: Payer: Self-pay | Admitting: Nephrology

## 2023-10-24 DIAGNOSIS — N179 Acute kidney failure, unspecified: Secondary | ICD-10-CM

## 2023-10-24 DIAGNOSIS — N182 Chronic kidney disease, stage 2 (mild): Secondary | ICD-10-CM

## 2023-10-25 NOTE — Telephone Encounter (Signed)
 Requested by interface sursescripts. Future visit 01/30/24.  Requested Prescriptions  Pending Prescriptions Disp Refills   finasteride  (PROSCAR ) 5 MG tablet [Pharmacy Med Name: FINASTERIDE  5 MG TABLET] 90 tablet 0    Sig: TAKE 1 TABLET (5 MG TOTAL) BY MOUTH DAILY.     Urology: 5-alpha Reductase Inhibitors Failed - 10/25/2023  3:37 PM      Failed - PSA in normal range and within 360 days    Prostate Specific Ag, Serum  Date Value Ref Range Status  08/03/2022 0.8 0.0 - 4.0 ng/mL Final    Comment:    Roche ECLIA methodology. According to the American Urological Association, Serum PSA should decrease and remain at undetectable levels after radical prostatectomy. The AUA defines biochemical recurrence as an initial PSA value 0.2 ng/mL or greater followed by a subsequent confirmatory PSA value 0.2 ng/mL or greater. Values obtained with different assay methods or kits cannot be used interchangeably. Results cannot be interpreted as absolute evidence of the presence or absence of malignant disease.          Passed - Valid encounter within last 12 months    Recent Outpatient Visits           4 weeks ago Chronic kidney disease (CKD) stage G3a/A2, moderately decreased glomerular filtration rate (GFR) between 45-59 mL/min/1.73 square meter and albuminuria creatinine ratio between 30-299 mg/g [facility])   [place] [facility] at [facility], Toribio [place]QUIBB, [place]   3 months ago Low back strain, initial encounter   [facility] at [facility], Toribio SQU[place]B, [place]   5 months ago Chronic kidney disease (CKD) stage G3a/A2, moderately decreased glomerular filtration rate (GFR) between 45-59 mL/min/1.73 square meter and albuminuria creatinine ratio between 30-299 mg/g [facility])   [facility] at [facility], Toribio SQUIBB, [place]

## 2023-10-26 ENCOUNTER — Ambulatory Visit
Admission: RE | Admit: 2023-10-26 | Discharge: 2023-10-26 | Disposition: A | Discharge status: Home / Self Care | Source: Ambulatory Visit | Attending: Nephrology | Admitting: Nephrology

## 2023-10-26 DIAGNOSIS — N179 Acute kidney failure, unspecified: Secondary | ICD-10-CM | POA: Diagnosis not present

## 2023-10-26 DIAGNOSIS — N182 Chronic kidney disease, stage 2 (mild): Secondary | ICD-10-CM | POA: Insufficient documentation

## 2023-11-16 ENCOUNTER — Encounter: Payer: Self-pay | Admitting: Pediatrics

## 2023-11-21 DIAGNOSIS — N182 Chronic kidney disease, stage 2 (mild): Secondary | ICD-10-CM | POA: Diagnosis not present

## 2023-11-21 DIAGNOSIS — N179 Acute kidney failure, unspecified: Secondary | ICD-10-CM | POA: Diagnosis not present

## 2023-11-24 DIAGNOSIS — N179 Acute kidney failure, unspecified: Secondary | ICD-10-CM | POA: Diagnosis not present

## 2023-11-24 DIAGNOSIS — R809 Proteinuria, unspecified: Secondary | ICD-10-CM | POA: Diagnosis not present

## 2023-11-24 DIAGNOSIS — N182 Chronic kidney disease, stage 2 (mild): Secondary | ICD-10-CM | POA: Diagnosis not present

## 2024-01-03 NOTE — Progress Notes (Signed)
 " [facility] Initial Consultation   Referring Provider Manya Toribio SQUIBB, [place] [address] [place],  [place] [address]  Primary Care Provider Manya Toribio SQUIBB, [place]  Patient Profile: Kyle Lawrence is a 78 y.o. male who is seen in consultation in the [facility] at the request of Dr. Manya for evaluation and management of the problem(s) noted below.  Problem List: History of adenomatous colon polyps Colonic diverticulosis Status postcholecystectomy   History of Present Illness    Discussed the use of AI scribe software for clinical note transcription with the patient, who gave verbal consent to proceed.  History of Present Illness Kyle Lawrence is a 78 year old gentleman with a past medical history noteworthy for CKD 3, HLD, gout, asthma, osteopenia who presents to discuss surveillance colonoscopy for history of adenomatous colon polyps  Colorectal polyp surveillance - Last and only colonoscopy performed at [facility] 2020 -Dr. Gaylyn  Colonoscopy was performed with moderate difficulty requiring changing patient to prone position  - Preparation of the colon was fair.  - One 2 mm polyp in the transverse colon, removed with a cold biopsy forceps. Resected and retrieved.  - One 4 mm polyp in the proximal ascending colon, removed piecemeal using a cold biopsy forceps. Resected and retrieved.  - Three less than 1 mm polyps in the rectum, removed with a cold biopsy forceps. Resected and retrieved. - Diverticulosis in the sigmoid colon, in the descending colon and in the transverse colon.  Path: A. COLON POLYP, TRANSVERSE; BIOPSY:  - TUBULAR ADENOMA.  -NEGATIVE FOR HIGH-GRADE DYSPLASIA AND MALIGNANCY.   B. COLON POLYP, PROXIMAL ASCENDING; BIOPSY:  - TUBULAR ADENOMA.  - NEGATIVE FOR HIGH-GRADE DYSPLASIA AND MALIGNANCY.   C. RECTUM POLYPS X3; BIOPSY:  - HYPERPLASTIC POLYPS (3).   - No recent changes in bowel habits, including diarrhea,  constipation, or hematochezia - No family history of polyps or colon cancer - May have consumed a MiraLAX prep with last colonoscopy -notation the bowel prep was fair  Sedation tolerance - No issues with sedation during previous colonoscopy - History of asthma -uses rescue albuterol  inhaler as needed with season changes but no current respiratory issues - Denies cardiac issues - No anticoagulation  GI Review of Symptoms Significant for None. Otherwise negative.  General Review of Systems  Review of systems is significant for the pertinent positives and negatives as listed per the HPI.  Full ROS is otherwise negative.  Past Medical History   Past Medical History:  Diagnosis Date   Arthritis    Asthma    asthmatic bronchitis   Chronic renal insufficiency    Solitary kidney   Congenital single kidney    Glossitis    Gout    Hydrocele    Hyperlipidemia      Past Surgical History   Past Surgical History:  Procedure Laterality Date   CHOLECYSTECTOMY     COLONOSCOPY WITH PROPOFOL  N/A 05/01/2018   Procedure: COLONOSCOPY WITH PROPOFOL ;  Surgeon: Gaylyn Gladis PENNER, MD;  Location: [facility] ENDOSCOPY;  Service: Endoscopy;  Laterality: N/A;   deviated septum repair       Allergies and Medications   Allergies  Allergen Reactions   Colchicine Diarrhea and Nausea Only   Flomax  [Tamsulosin ] Other (See Comments)    Tired and lightheaded, syncope    Current Meds  Medication Sig   albuterol  (VENTOLIN  HFA) 108 (90 Base) MCG/ACT inhaler Inhale 2 puffs into the lungs every 4 (four) hours as needed for wheezing or shortness of  breath.   allopurinol  (ZYLOPRIM ) 100 MG tablet Take 1 tablet (100 mg total) by mouth daily.   Ascorbic Acid (VITA-C PO) Take by mouth.   calcium  carbonate (OSCAL) 1500 (600 Ca) MG TABS tablet Take 600 mg of elemental calcium  by mouth 2 (two) times daily with a meal.   Cholecalciferol (VITAMIN D ) 125 MCG (5000 UT) CAPS Take 5,000 Units by mouth daily.    finasteride  (PROSCAR ) 5 MG tablet TAKE 1 TABLET (5 MG TOTAL) BY MOUTH DAILY.   Na Sulfate-K Sulfate-Mg Sulfate concentrate (SUPREP) 17.5-3.13-1.6 GM/177ML SOLN Take 1 kit (354 mLs total) by mouth once for 1 dose.   rosuvastatin  (CRESTOR ) 5 MG tablet Take 1 tablet (5 mg total) by mouth daily.   tiZANidine  (ZANAFLEX ) 2 MG tablet Take 1 tablet (2 mg total) by mouth every 8 (eight) hours as needed for muscle spasms.     Family History   Family History  Problem Relation Age of Onset   Diabetes Mother    Kidney disease Father    Diabetes Maternal Uncle      Social History   Social History   Tobacco Use   Smoking status: Never   Smokeless tobacco: Never  Vaping Use   Vaping status: Never Used  Substance Use Topics   Alcohol use: Yes    Comment: 1 glass of wine 2-3 times per month   Drug use: Never   Kyle Lawrence reports that he has never smoked. He has never used smokeless tobacco. He reports current alcohol use. He reports that he does not use drugs.  Previously employed in optometrist retired Never smoker Consumes social alcohol-3-4 alcoholic beverages per month  Vital Signs and Physical Examination   Vitals:   01/04/24 0815  BP: 128/66  Pulse: 71   Body mass index is 29.04 kg/m. Weight: 202 lb 6 oz (91.8 kg)  General: Well developed, well nourished, no acute distress Head: Normocephalic and atraumatic Eyes: Sclerae anicteric, EOMI Lungs: Clear throughout to auscultation Heart: Regular rate and rhythm; No murmurs, rubs or bruits Abdomen: Soft, non tender and non distended. No masses, hepatosplenomegaly or hernias noted. Normal Bowel sounds Rectal: Deferred Musculoskeletal: Symmetrical with no gross deformities    Review of Data  The following data was reviewed at the time of this encounter:  Laboratory Studies      Latest Ref Rng & Units 05/25/2023    8:59 AM 08/03/2022   10:33 AM  CBC  WBC 3.4 - 10.8 x10E3/uL 5.6  7.8   Hemoglobin 13.0 - 17.7 g/dL  86.0  84.5   Hematocrit 37.5 - 51.0 % 42.7  47.5   Platelets 150 - 450 x10E3/uL 202  187     No results found for: LIPASE    Latest Ref Rng & Units 10/11/2023    9:33 AM 09/27/2023    8:47 AM 05/25/2023    8:59 AM  CMP  Glucose 70 - 99 mg/dL 94  897  91   BUN 8 - 27 mg/dL 14  27  26    Creatinine 0.76 - 1.27 mg/dL 8.77  8.00  8.66   Sodium 134 - 144 mmol/L 138  139  142   Potassium 3.5 - 5.2 mmol/L 4.9  4.6  4.3   Chloride 96 - 106 mmol/L 101  102  109   CO2 20 - 29 mmol/L 23  20  19    Calcium  8.6 - 10.2 mg/dL 89.7  89.9  9.7   Total Protein 6.0 - 8.5 g/dL  6.7   Total Bilirubin 0.0 - 1.2 mg/dL   0.5   Alkaline Phos 44 - 121 IU/L   72   AST 0 - 40 IU/L   19   ALT 0 - 44 IU/L   13      Imaging Studies  None  GI Procedures and Studies  Colonoscopy 05/01/2018  Colonoscopy was performed with moderate difficulty requiring changing patient to prone position  - Preparation of the colon was fair.  - One 2 mm polyp in the transverse colon, removed with a cold biopsy forceps. Resected and retrieved.  - One 4 mm polyp in the proximal ascending colon, removed piecemeal using a cold biopsy forceps. Resected and retrieved.  - Three less than 1 mm polyps in the rectum, removed with a cold biopsy forceps. Resected and retrieved. - Diverticulosis in the sigmoid colon, in the descending colon and in the transverse colon.  Path: A. COLON POLYP, TRANSVERSE; BIOPSY:  - TUBULAR ADENOMA.  -NEGATIVE FOR HIGH-GRADE DYSPLASIA AND MALIGNANCY.   B. COLON POLYP, PROXIMAL ASCENDING; BIOPSY:  - TUBULAR ADENOMA.  - NEGATIVE FOR HIGH-GRADE DYSPLASIA AND MALIGNANCY.   C. RECTUM POLYPS X3; BIOPSY:  - HYPERPLASTIC POLYPS (3).    Clinical Impression  It is my clinical impression that Kyle Lawrence is a 78 y.o. male with;  History of adenomatous colon polyps Colonic diverticulosis Status postcholecystectomy  Kyle Lawrence presents to the office for consultation regarding surveillance colonoscopy for  a history of adenomatous colon polyps.  His 1 and only colonoscopy was performed in 2020 at [facility].  5 polyps were resected-2 were tubular adenomas and 3 were hyperplastic polyps.  Notation that reparation of the colon was fair and it was a moderately difficult procedure potentially due to tortuosity/looping.  No family history of colorectal cancer or polyps.  Given fair bowel prep appropriate to perform colonoscopy at a 5-year interval.  Patient denies constipation that would have impacted his previous bowel preparation quality.  Believes he may have used a MiraLAX prep.  Discussed the use of Suprep for next colonoscopy.  Review of anesthetic risk factors indicates that he has asthma but it is generally well-controlled-no other cardiopulmonary problems.  Plan  Schedule screening colonoscopy at [facility] with 1 day Suprep bowel prep -will utilize an abdominal binder given previous history of difficult colonoscopy.  Planned Follow Up PRN  The patient or caregiver verbalized understanding of the material covered, with no barriers to understanding. All questions were answered. Patient or caregiver is agreeable with the plan outlined above.    It was a pleasure to see Kyle Lawrence.  If you have any questions or concerns regarding this evaluation, do not hesitate to contact me.  Inocente Hausen, MD [facility]  "

## 2024-01-04 ENCOUNTER — Ambulatory Visit (INDEPENDENT_AMBULATORY_CARE_PROVIDER_SITE_OTHER): Admitting: Pediatrics

## 2024-01-04 ENCOUNTER — Encounter: Payer: Self-pay | Admitting: Pediatrics

## 2024-01-04 VITALS — BP 128/66 | HR 71 | Ht 70.0 in | Wt 202.4 lb

## 2024-01-04 DIAGNOSIS — Z860100 Personal history of colon polyps, unspecified: Secondary | ICD-10-CM

## 2024-01-04 MED ORDER — NA SULFATE-K SULFATE-MG SULF 17.5-3.13-1.6 GM/177ML PO SOLN: 1.0000 | Freq: Once | ORAL | 0 refills | Status: AC

## 2024-01-04 NOTE — Patient Instructions (Addendum)
 You have been scheduled for a colonoscopy. Please follow written instructions given to you at your visit today.   If you use inhalers (even only as needed), please bring them with you on the day of your procedure.  DO NOT TAKE 7 DAYS PRIOR TO TEST- Trulicity (dulaglutide) Ozempic, Wegovy (semaglutide) Mounjaro (tirzepatide) Bydureon Bcise (exanatide extended release)  DO NOT TAKE 1 DAY PRIOR TO YOUR TEST Rybelsus (semaglutide) Adlyxin (lixisenatide) Victoza (liraglutide) Byetta (exanatide) ___________________________________________________________________________  Follow up as needed.  Thank you for entrusting me with your care and for choosing [facility], Dr. Inocente Hausen  _______________________________________________________  If your blood pressure at your visit was 140/90 or greater, please contact your primary care physician to follow up on this.  _______________________________________________________  If you are age 13 or older, your body mass index should be between 23-30. Your Body mass index is 29.04 kg/m. If this is out of the aforementioned range listed, please consider follow up with your Primary Care Provider.  If you are age 62 or younger, your body mass index should be between 19-25. Your Body mass index is 29.04 kg/m. If this is out of the aformentioned range listed, please consider follow up with your Primary Care Provider.   ________________________________________________________  The [facility] providers would like to encourage you to use MYCHART to communicate with providers for non-urgent requests or questions.  Due to long hold times on the telephone, sending your provider a message by [facility] may be a faster and more efficient way to get a response.  Please allow 48 business hours for a response.  Please remember that this is for non-urgent requests.  _______________________________________________________  [facility] is using a  team-based approach to care.  Your team is made up of your doctor and two to three APPS. Our APPS (Nurse Practitioners and Physician Assistants) work with your physician to ensure care continuity for you. They are fully qualified to address your health concerns and develop a treatment plan. They communicate directly with your gastroenterologist to care for you. Seeing the Advanced Practice Practitioners on your physician's team can help you by facilitating care more promptly, often allowing for earlier appointments, access to diagnostic testing, procedures, and other specialty referrals.

## 2024-01-18 NOTE — Progress Notes (Unsigned)
 [facility] History and Physical   Primary Care Physician:  Manya Toribio SQUIBB, PA   Reason for Procedure:  History of adenomatous colon polyps  Plan:    Colonoscopy     HPI: Kyle Lawrence is a 78 y.o. male undergoing colonoscopy for history of adenomatous colon polyps.  Patient underwent colonoscopy in 2020 which showed 2 tubular adenomas and 3 hyperplastic polyps.  Bowel preparation was fair and colonoscopy was performed with moderate difficulty due to tortuosity and looping.  No family history of colorectal cancer or polyps.   Past Medical History:  Diagnosis Date   Arthritis    Asthma    asthmatic bronchitis   Chronic renal insufficiency    Solitary kidney   Congenital single kidney    Glossitis    Gout    Hydrocele    Hyperlipidemia     Past Surgical History:  Procedure Laterality Date   CHOLECYSTECTOMY     COLONOSCOPY WITH PROPOFOL  N/A 05/01/2018   Procedure: COLONOSCOPY WITH PROPOFOL ;  Surgeon: Gaylyn Gladis PENNER, MD;  Location: [facility] ENDOSCOPY;  Service: Endoscopy;  Laterality: N/A;   deviated septum repair      Prior to Admission medications   Medication Sig Start Date End Date Taking? Authorizing Provider  albuterol  (VENTOLIN  HFA) 108 (90 Base) MCG/ACT inhaler Inhale 2 puffs into the lungs every 4 (four) hours as needed for wheezing or shortness of breath. 10/25/22   Manya Toribio SQUIBB, PA  allopurinol  (ZYLOPRIM ) 100 MG tablet Take 1 tablet (100 mg total) by mouth daily. 01/26/23   Manya Toribio SQUIBB, PA  Ascorbic Acid (VITA-C PO) Take by mouth.    [provider]  calcium  carbonate (OSCAL) 1500 (600 Ca) MG TABS tablet Take 600 mg of elemental calcium  by mouth 2 (two) times daily with a meal.    [provider]  Cholecalciferol (VITAMIN D ) 125 MCG (5000 UT) CAPS Take 5,000 Units by mouth daily. 01/26/23   Manya Toribio SQUIBB, PA  finasteride  (PROSCAR ) 5 MG tablet TAKE 1 TABLET (5 MG TOTAL) BY MOUTH DAILY. 10/25/23   Manya Toribio SQUIBB, PA   rosuvastatin  (CRESTOR ) 5 MG tablet Take 1 tablet (5 mg total) by mouth daily. 01/26/23   Manya Toribio SQUIBB, PA  tiZANidine  (ZANAFLEX ) 2 MG tablet Take 1 tablet (2 mg total) by mouth every 8 (eight) hours as needed for muscle spasms. 07/15/23   Manya Toribio SQUIBB, PA    Current Outpatient Medications  Medication Sig Dispense Refill   allopurinol  (ZYLOPRIM ) 100 MG tablet Take 1 tablet (100 mg total) by mouth daily. 90 tablet 3   Ascorbic Acid (VITA-C PO) Take by mouth.     calcium  carbonate (OSCAL) 1500 (600 Ca) MG TABS tablet Take 600 mg of elemental calcium  by mouth 2 (two) times daily with a meal.     Cholecalciferol (VITAMIN D ) 125 MCG (5000 UT) CAPS Take 5,000 Units by mouth daily.     finasteride  (PROSCAR ) 5 MG tablet TAKE 1 TABLET (5 MG TOTAL) BY MOUTH DAILY. 90 tablet 0   rosuvastatin  (CRESTOR ) 5 MG tablet Take 1 tablet (5 mg total) by mouth daily. 90 tablet 3   tiZANidine  (ZANAFLEX ) 2 MG tablet Take 1 tablet (2 mg total) by mouth every 8 (eight) hours as needed for muscle spasms. 30 tablet 0   albuterol  (VENTOLIN  HFA) 108 (90 Base) MCG/ACT inhaler Inhale 2 puffs into the lungs every 4 (four) hours as needed for wheezing or shortness of breath. 1 each 5   Current Facility-Administered  Medications  Medication Dose Route Frequency Provider Last Rate Last Admin   0.9 %  sodium chloride  infusion  500 mL Intravenous Once Ersel Wadleigh, Inocente HERO, MD        Allergies as of 01/19/2024 - Review Complete 01/19/2024  Allergen Reaction Noted   Flomax  [tamsulosin ] Other (See Comments) 10/25/2022   Colchicine Diarrhea and Nausea Only 07/17/2018    Family History  Problem Relation Age of Onset   Diabetes Mother    Kidney disease Father    Diabetes Maternal Uncle    Colon cancer Neg Hx    Esophageal cancer Neg Hx    Stomach cancer Neg Hx    Rectal cancer Neg Hx     Social History   Socioeconomic History   Marital status: Married    Spouse name: Olam   Number of children: 2   Years of  education: Not on file   Highest education level: Not on file  Occupational History   Occupation: retired  Tobacco Use   Smoking status: Never   Smokeless tobacco: Never  Vaping Use   Vaping status: Never Used  Substance and Sexual Activity   Alcohol use: Yes    Comment: 1 glass of wine 2-3 times per month   Drug use: Never   Sexual activity: Yes    Birth control/protection: None  Other Topics Concern   Not on file  Social History Narrative   Not on file   Social Drivers of Health   Financial Resource Strain: Low Risk  (09/07/2023)   Overall Financial Resource Strain (CARDIA)    Difficulty of Paying Living Expenses: Not hard at all  Food Insecurity: No Food Insecurity (09/07/2023)   Hunger Vital Sign    Worried About Running Out of Food in the Last Year: Never true    Ran Out of Food in the Last Year: Never true  Transportation Needs: No Transportation Needs (09/07/2023)   PRAPARE - Administrator, Civil Service (Medical): No    Lack of Transportation (Non-Medical): No  Physical Activity: Sufficiently Active (09/07/2023)   Exercise Vital Sign    Days of Exercise per Week: 7 days    Minutes of Exercise per Session: 60 min  Stress: No Stress Concern Present (09/07/2023)   [employer] of Occupational Health - Occupational Stress Questionnaire    Feeling of Stress: Not at all  Social Connections: Moderately Isolated (09/07/2023)   Social Connection and Isolation Panel    Frequency of Communication with Friends and Family: More than three times a week    Frequency of Social Gatherings with Friends and Family: More than three times a week    Attends Religious Services: Never    Database Administrator or Organizations: No    Attends Banker Meetings: Never    Marital Status: Married  Catering Manager Violence: Not At Risk (09/07/2023)   Humiliation, Afraid, Rape, and Kick questionnaire    Fear of Current or Ex-Partner: No    Emotionally Abused: No     Physically Abused: No    Sexually Abused: No    Review of Systems:  All other review of systems negative except as mentioned in the HPI.  Physical Exam: Vital signs BP 131/86   Pulse 78   Temp 98 F (36.7 C) (Skin)   Ht 5' 10 (1.778 m)   Wt 202 lb 9.6 oz (91.9 kg)   SpO2 95%   BMI 29.07 kg/m   General:   Alert,  Well-developed,  well-nourished, pleasant and cooperative in NAD Airway:  Mallampati 2 Lungs:  Clear throughout to auscultation.   Heart:  Regular rate and rhythm; no murmurs, clicks, rubs,  or gallops. Abdomen:  Soft, nontender and nondistended. Normal bowel sounds.   Neuro/Psych:  Normal mood and affect. A and O x 3  Inocente Hausen, MD [facility]

## 2024-01-19 ENCOUNTER — Ambulatory Visit: Admitting: Pediatrics

## 2024-01-19 ENCOUNTER — Encounter: Payer: Self-pay | Admitting: Pediatrics

## 2024-01-19 VITALS — BP 106/64 | HR 71 | Temp 98.0°F | Resp 14 | Ht 70.0 in | Wt 202.6 lb

## 2024-01-19 DIAGNOSIS — D122 Benign neoplasm of ascending colon: Secondary | ICD-10-CM

## 2024-01-19 DIAGNOSIS — Z860101 Personal history of adenomatous and serrated colon polyps: Secondary | ICD-10-CM | POA: Diagnosis not present

## 2024-01-19 DIAGNOSIS — Z860100 Personal history of colon polyps, unspecified: Secondary | ICD-10-CM

## 2024-01-19 DIAGNOSIS — Z1211 Encounter for screening for malignant neoplasm of colon: Secondary | ICD-10-CM | POA: Diagnosis not present

## 2024-01-19 MED ORDER — SODIUM CHLORIDE 0.9 % IV SOLN: 500.0000 mL | Freq: Once | INTRAVENOUS | Status: DC

## 2024-01-19 NOTE — Progress Notes (Signed)
 Sedate, gd SR, tolerated procedure well, VSS, report to RN

## 2024-01-19 NOTE — Patient Instructions (Addendum)
 Resume previous diet Continue present medications Await pathology results  You will NOT need another screening colonoscopy, however, PLEASE call us  at [phone] if you have a change in bowel habits, change in family history of colo-rectal cancer, rectal bleeding or other GI concern IN THE FUTURE!  Handouts/information given for polyps  YOU HAD AN ENDOSCOPIC PROCEDURE TODAY AT THE [facility]:   Refer to the procedure report that was given to you for any specific questions about what was found during the examination.  If the procedure report does not answer your questions, please call your gastroenterologist to clarify.  If you requested that your care partner not be given the details of your procedure findings, then the procedure report has been included in a sealed envelope for you to review at your convenience later.  YOU SHOULD EXPECT: Some feelings of bloating in the abdomen. Passage of more gas than usual.  Walking can help get rid of the air that was put into your GI tract during the procedure and reduce the bloating. If you had a lower endoscopy (such as a colonoscopy or flexible sigmoidoscopy) you may notice spotting of blood in your stool or on the toilet paper. If you underwent a bowel prep for your procedure, you may not have a normal bowel movement for a few days.  Please Note:  You might notice some irritation and congestion in your nose or some drainage.  This is from the oxygen used during your procedure.  There is no need for concern and it should clear up in a day or so.  SYMPTOMS TO REPORT IMMEDIATELY:  Following lower endoscopy (colonoscopy):  Excessive amounts of blood in the stool  Significant tenderness or worsening of abdominal pains  Swelling of the abdomen that is new, acute  Fever of 100F or higher For urgent or emergent issues, a gastroenterologist can be reached at any hour by calling [phone]. Do not use MyChart messaging for urgent concerns.    DIET:  We do recommend a small meal at first, but then you may proceed to your regular diet.  Drink plenty of fluids but you should avoid alcoholic beverages for 24 hours.  ACTIVITY:  You should plan to take it easy for the rest of today and you should NOT DRIVE or use heavy machinery until tomorrow (because of the sedation medicines used during the test).    FOLLOW UP: Our staff will call the number listed on your records the next business day following your procedure.  We will call around 7:15- 8:00 am to check on you and address any questions or concerns that you may have regarding the information given to you following your procedure. If we do not reach you, we will leave a message.     If any biopsies were taken you will be contacted by phone or by letter within the next 1-3 weeks.  Please call us  at [phone] if you have not heard about the biopsies in 3 weeks.    SIGNATURES/CONFIDENTIALITY: You and/or your care partner have signed paperwork which will be entered into your electronic medical record.  These signatures attest to the fact that that the information above on your After Visit Summary has been reviewed and is understood.  Full responsibility of the confidentiality of this discharge information lies with you and/or your care-partner.

## 2024-01-19 NOTE — Op Note (Signed)
 [facility] Patient Name: Kyle Lawrence Procedure Date: 01/19/2024 2:33 PM MRN: [id number] Endoscopist: Inocente Hausen , MD, [id number] Age: 78 Referring MD:  Date of Birth: 09/13/1945 Gender: Male Account #: [id number] Procedure:                Colonoscopy Indications:              High risk colon cancer surveillance: Personal                            history of non-advanced adenoma, Last colonoscopy:                            2020 Medicines:                Monitored Anesthesia Care Procedure:                Pre-Anesthesia Assessment:                           - Prior to the procedure, a History and Physical                            was performed, and patient medications and                            allergies were reviewed. The patient's tolerance of                            previous anesthesia was also reviewed. The risks                            and benefits of the procedure and the sedation                            options and risks were discussed with the patient.                            All questions were answered, and informed consent                            was obtained. Prior Anticoagulants: The patient has                            taken no anticoagulant or antiplatelet agents. ASA                            Grade Assessment: II - A patient with mild systemic                            disease. After reviewing the risks and benefits,                            the patient was deemed in satisfactory condition to  undergo the procedure.                           After obtaining informed consent, the colonoscope                            was passed under direct vision. Throughout the                            procedure, the patient's blood pressure, pulse, and                            oxygen saturations were monitored continuously. The                            Colonoscope was introduced through the anus and                             advanced to the cecum, identified by appendiceal                            orifice and ileocecal valve. The colonoscopy was                            performed without difficulty. The patient tolerated                            the procedure well. The quality of the bowel                            preparation was good. The ileocecal valve,                            appendiceal orifice, and rectum were photographed. Scope In: 2:37:22 PM Scope Out: 2:54:30 PM Scope Withdrawal Time: 0 hours 12 minutes 55 seconds  Total Procedure Duration: 0 hours 17 minutes 8 seconds  Findings:                 The perianal and digital rectal examinations were                            normal. Pertinent negatives include normal                            sphincter tone and no palpable rectal lesions.                           A 4 mm polyp was found in the ascending colon. The                            polyp was sessile. The polyp was removed with a                            cold biopsy forceps. Resection and retrieval were  complete.                           There was evidence of colon spasm throughout the                            exam limiting some views of the colon, however, no                            large polyps or mass lesions were seen.                           The retroflexed view of the distal rectum and anal                            verge was normal and showed no anal or rectal                            abnormalities. Complications:            No immediate complications. Estimated blood loss:                            Minimal. Estimated Blood Loss:     Estimated blood loss was minimal. Impression:               - One 4 mm polyp in the ascending colon, removed                            with a cold biopsy forceps. Resected and retrieved.                           - The distal rectum and anal verge are normal on                             retroflexion view. Recommendation:           - Discharge patient to home (ambulatory).                           - Await pathology results.                           - The findings and recommendations were discussed                            with the patient's family.                           - Patient has a contact number available for                            emergencies. The signs and symptoms of potential                            delayed complications were discussed with the  patient. Return to normal activities tomorrow.                            Written discharge instructions were provided to the                            patient. Inocente Hausen, MD 01/19/2024 3:00:24 PM This report has been signed electronically.

## 2024-01-20 ENCOUNTER — Telehealth: Payer: Self-pay

## 2024-01-20 ENCOUNTER — Other Ambulatory Visit: Payer: Self-pay | Admitting: Physician Assistant

## 2024-01-20 DIAGNOSIS — N401 Enlarged prostate with lower urinary tract symptoms: Secondary | ICD-10-CM

## 2024-01-20 NOTE — Telephone Encounter (Signed)
 Requested medications are due for refill today.  yes  Requested medications are on the active medications list.  yes  Last refill. 10/25/2023 #90 0 rf  Future visit scheduled.   yes  Notes to clinic.  Labs are expired    Requested Prescriptions  Pending Prescriptions Disp Refills   finasteride  (PROSCAR ) 5 MG tablet [Pharmacy Med Name: FINASTERIDE  5 MG TABLET] 90 tablet 0    Sig: TAKE 1 TABLET (5 MG TOTAL) BY MOUTH DAILY.     Urology: 5-alpha Reductase Inhibitors Failed - 01/20/2024  5:39 PM      Failed - PSA in normal range and within 360 days    Prostate Specific Ag, Serum  Date Value Ref Range Status  08/03/2022 0.8 0.0 - 4.0 ng/mL Final    Comment:    Roche ECLIA methodology. According to the American Urological Association, Serum PSA should decrease and remain at undetectable levels after radical prostatectomy. The AUA defines biochemical recurrence as an initial PSA value 0.2 ng/mL or greater followed by a subsequent confirmatory PSA value 0.2 ng/mL or greater. Values obtained with different assay methods or kits cannot be used interchangeably. Results cannot be interpreted as absolute evidence of the presence or absence of malignant disease.          Passed - Valid encounter within last 12 months    Recent Outpatient Visits           3 months ago Chronic kidney disease (CKD) stage G3a/A2, moderately decreased glomerular filtration rate (GFR) between 45-59 mL/min/1.73 square meter and albuminuria creatinine ratio between 30-299 mg/g [facility])   [facility], Toribio SQUIBB, [place]   6 months ago Low back strain, initial encounter   [facility] at [facility], Toribio SQUIBB, [place]   8 months ago Chronic kidney disease (CKD) stage G3a/A2, moderately decreased glomerular filtration rate (GFR) between 45-59 mL/min/1.73 square meter and albuminuria creatinine ratio between 30-299 mg/g [facility])    [facility] at [facility], Toribio SQUIBB, [place]

## 2024-01-20 NOTE — Telephone Encounter (Signed)
 Left message on follow up call.

## 2024-01-24 ENCOUNTER — Ambulatory Visit: Payer: Self-pay | Admitting: Pediatrics

## 2024-01-24 LAB — SURGICAL PATHOLOGY

## 2024-01-30 ENCOUNTER — Encounter: Payer: Self-pay | Admitting: Physician Assistant

## 2024-01-30 ENCOUNTER — Ambulatory Visit (INDEPENDENT_AMBULATORY_CARE_PROVIDER_SITE_OTHER): Admitting: Physician Assistant

## 2024-01-30 VITALS — BP 122/82 | HR 75 | Temp 98.0°F | Ht 70.0 in | Wt 204.0 lb

## 2024-01-30 DIAGNOSIS — N1831 Chronic kidney disease, stage 3a: Secondary | ICD-10-CM | POA: Diagnosis not present

## 2024-01-30 DIAGNOSIS — Z0000 Encounter for general adult medical examination without abnormal findings: Secondary | ICD-10-CM | POA: Diagnosis not present

## 2024-01-30 DIAGNOSIS — Z23 Encounter for immunization: Secondary | ICD-10-CM | POA: Diagnosis not present

## 2024-01-30 DIAGNOSIS — L821 Other seborrheic keratosis: Secondary | ICD-10-CM | POA: Diagnosis not present

## 2024-01-30 NOTE — Progress Notes (Signed)
 Date:  01/30/2024   Name:  Kyle Lawrence   DOB:  [birth year]   MRN:  [id number]   Chief Complaint: Annual Exam  HPI  Stuart returns for routine physical today with no particular complaints. He was seen by dermatology earlier this year for large SK of the back (and numerous SKs in general) and was told it was not concerning.  He is followed regularly by nephrology with recent labs 11/21/2023 which I have reviewed; CKD 3a. He remains active with frequent [place] and [place] gotten [place]   Last Physical: 08/18/22 Last Dental Exam: 4y ago Last Eye Exam: 1-2y ago Last CRC screen: 01/19/24 tubular adenoma but no further surveillance colonoscopies recommended due to age Last PSA: 08/03/22 was 0.8. Probably will discontinue screening Immunizations Due: Shingrix dose 2  Medication list has been reviewed and updated.  Current Meds  Medication Sig   albuterol  (VENTOLIN  HFA) 108 (90 Base) MCG/ACT inhaler Inhale 2 puffs into the lungs every 4 (four) hours as needed for wheezing or shortness of breath.   allopurinol  (ZYLOPRIM ) 100 MG tablet Take 1 tablet (100 mg total) by mouth daily.   Ascorbic Acid (VITA-C PO) Take by mouth.   calcium  carbonate (OSCAL) 1500 (600 Ca) MG TABS tablet Take 600 mg of elemental calcium  by mouth 2 (two) times daily with a meal.   Cholecalciferol (VITAMIN D ) 125 MCG (5000 UT) CAPS Take 5,000 Units by mouth daily.   finasteride  (PROSCAR ) 5 MG tablet TAKE 1 TABLET (5 MG TOTAL) BY MOUTH DAILY.   rosuvastatin  (CRESTOR ) 5 MG tablet Take 1 tablet (5 mg total) by mouth daily.   tiZANidine  (ZANAFLEX ) 2 MG tablet Take 1 tablet (2 mg total) by mouth every 8 (eight) hours as needed for muscle spasms.     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   Orchalgia 11/20/2013   Hyperlipidemia 11/20/2013   Hydrocele 11/20/2013   Glossitis 11/20/2013   Chronic kidney disease (CKD) stage G3a/A2, moderately decreased glomerular filtration rate (GFR) between 45-59 mL/min/1.73 square meter and albuminuria creatinine ratio between 30-299 mg/g (HCC) 11/20/2013   Gout 11/05/2013   Encysted hydrocele 09/22/2012   Benign prostatic hyperplasia with lower urinary tract symptoms 09/22/2012    Allergies  Allergen Reactions   Flomax  [Tamsulosin ] Other (See Comments)    Tired and lightheaded, syncope, blacked out   Colchicine Diarrhea and Nausea Only    Immunization History  Administered Date(s) Administered   Fluad Quad(high Dose 65+) 01/26/2023   INFLUENZA, HIGH DOSE SEASONAL PF 11/24/2023   Influenza Inj Mdck Quad Pf 01/23/2018   PNEUMOCOCCAL CONJUGATE-20 10/25/2022   Pneumococcal Polysaccharide-23 10/17/2018   Tdap 10/26/2022   Zoster Recombinant(Shingrix) 10/26/2022    Past Surgical History:  Procedure Laterality Date   CHOLECYSTECTOMY     COLONOSCOPY WITH PROPOFOL  N/A 05/01/2018   Procedure: COLONOSCOPY WITH PROPOFOL ;  Surgeon: Gaylyn Gladis PENNER, MD;  Location: [facility] END[facility]COPY;  Service: Endoscopy;  Laterality: N/A;   deviated septum repair      Social History  Tobacco Use   Smoking status: Never   Smokeless tobacco: Never  Vaping Use   Vaping status: Never Used  Substance Use Topics   Alcohol use: Yes    Comment: 1 glass of wine 2-3 times per month   Drug use: Never    Family History  Problem Relation Age of Onset   Diabetes Mother    Kidney disease Father    Diabetes Maternal Uncle    Colon cancer  Neg Hx    Esophageal cancer Neg Hx    Stomach cancer Neg Hx    Rectal cancer Neg Hx         01/30/2024    9:02 AM 09/27/2023    8:05 AM 07/15/2023    9:03 AM 05/25/2023    8:03 AM  GAD 7 : Generalized Anxiety Score  Nervous, Anxious, on Edge 0 0 0 0  Control/stop worrying 0 0 0 0  Worry too much - different things 0 0  0  Trouble relaxing 0 0  0  Restless 0 0  0  Easily annoyed or irritable 0 0  0  Afraid - awful might happen 0 0  0  Total GAD 7 Score 0 0  0  Anxiety Difficulty Not difficult at all Not difficult at all  Not difficult at all       01/30/2024    9:02 AM 09/27/2023    8:05 AM 09/07/2023    8:17 AM  Depression screen PHQ 2/9  Decreased Interest 0 0 0  Down, Depressed, Hopeless 0 0 0  PHQ - 2 Score 0 0 0  Altered sleeping   0  Tired, decreased energy   0  Change in appetite   0  Feeling bad or failure about yourself    0  Trouble concentrating   0  Moving slowly or fidgety/restless   0  Suicidal thoughts   0  PHQ-9 Score   0   Difficult doing work/chores   Not difficult at all     Data saved with a previous flowsheet row definition    BP Readings from Last 3 Encounters:  01/30/24 122/82  01/19/24 106/64  01/04/24 128/66    Wt Readings from Last 3 Encounters:  01/30/24 204 lb (92.5 kg)  01/19/24 202 lb 9.6 oz (91.9 kg)  01/04/24 202 lb 6 oz (91.8 kg)    BP 122/82   Pulse 75   Temp 98 F (36.7 C)   Ht 5' 10 (1.778 m)   Wt 204 lb (92.5 kg)   SpO2 96%   BMI 29.27 kg/m   Physical Exam Vitals and nursing note reviewed.  Constitutional:      Appearance: Normal appearance.  HENT:     Ears:     Comments: EAC clear bilaterally with good view of TM which is without effusion or erythema.     Nose: Nose normal.     Mouth/Throat:     Mouth: Mucous membranes are moist. No oral lesions.     Dentition: Normal dentition.     Pharynx: No posterior oropharyngeal erythema.  Eyes:     Extraocular Movements: Extraocular movements intact.      Conjunctiva/sclera: Conjunctivae normal.     Pupils: Pupils are equal, round, and reactive to light.  Neck:     Thyroid: No thyromegaly.     Vascular: No carotid bruit.  Cardiovascular:     Rate and Rhythm: Normal rate and regular rhythm.     Heart sounds: No murmur  heard.    No friction rub. No gallop.     Comments: Pulses 2+ at radial, PT, DP bilaterally. No carotid bruit. No peripheral edema Pulmonary:     Effort: Pulmonary effort is normal.     Breath sounds: Normal breath sounds.  Abdominal:     General: Bowel sounds are normal. There is no distension.     Palpations: Abdomen is soft. There is no mass.     Tenderness: There is no abdominal tenderness.  Genitourinary:    Comments: Genital/rectal exam deferred. Musculoskeletal:        General: Normal range of motion.     Comments: Full ROM with strength 5/5 bilateral upper and lower extremities  Lymphadenopathy:     Cervical: No cervical adenopathy.  Skin:    General: Skin is warm and dry.     Capillary Refill: Capillary refill takes less than 2 seconds.     Findings: No lesion or rash.     Comments: Numerous SK of the back the largest of which measures about 15 mm in the left periscapular region. There is a 5 mm papule of the mid back with red/purple hue - could be inflamed SK as patient reports using a back scratcher 2 days ago  Neurological:     Mental Status: He is alert and oriented to person, place, and time.     Gait: Gait is intact.  Psychiatric:        Mood and Affect: Mood and affect normal.        Behavior: Behavior normal.     Recent Labs     Component Value Date/Time   NA 138 10/11/2023 0933   K 4.9 10/11/2023 0933   CL 101 10/11/2023 0933   CO2 23 10/11/2023 0933   GLUCOSE 94 10/11/2023 0933   BUN 14 10/11/2023 0933   CREATININE 1.22 10/11/2023 0933   CALCIUM  10.2 10/11/2023 0933   PROT 6.7 05/25/2023 0859   ALBUMIN 4.4 05/25/2023 0859   AST 19 05/25/2023 0859   ALT 13 05/25/2023 0859   ALKPHOS 72  05/25/2023 0859   BILITOT 0.5 05/25/2023 0859    Lab Results  Component Value Date   WBC 5.6 05/25/2023   HGB 13.9 05/25/2023   HCT 42.7 05/25/2023   MCV 93 05/25/2023   PLT 202 05/25/2023   No results found for: HGBA1C Lab Results  Component Value Date   CHOL 128 05/25/2023   HDL 42 05/25/2023   LDLCALC 68 05/25/2023   TRIG 96 05/25/2023   CHOLHDL 3.0 05/25/2023   No results found for: TSH    Assessment and Plan:  1. Annual physical exam (Primary) Encouraged healthy lifestyle including regular physical activity and consumption of whole fruits and vegetables. Encouraged routine dental and eye exams.  2. Chronic kidney disease (CKD) stage G3a/A2, moderately decreased glomerular filtration rate (GFR) between 45-59 mL/min/1.73 square meter and albuminuria creatinine ratio between 30-299 mg/g (HCC) Continue regular follow up with nephrology. Next visit planned for March 2026  3. Seborrheic keratoses Encouraged to return to dermatology for skin check. Would appreciate second opinion on the red/purple papule on the mid back  4. Immunization due Encouraged to get Shingrix dose 2 at [facility]. He plans to do so in the next couple weeks.    F/u 75m OV f/u HLD F/u 1y CPE  Rolan Hoyle, PA-C, DMSc, Nutritionist [facility] [phone]

## 2024-01-30 NOTE — Patient Instructions (Addendum)
-  It was a pleasure to see you today! Please review your visit summary for helpful information -I would encourage you to follow your care via MyChart where you can access lab results, notes, messages, and more -If you feel that we did a nice job today, please complete your after-visit survey and leave us a Google review! Your CMA today was Kieandra and your provider was Dan Waddell, PA-C, DMSc -Please return for follow-up in about 6 months  

## 2024-03-23 ENCOUNTER — Encounter: Payer: Self-pay | Admitting: Physician Assistant

## 2024-03-23 ENCOUNTER — Ambulatory Visit (INDEPENDENT_AMBULATORY_CARE_PROVIDER_SITE_OTHER): Admitting: Physician Assistant

## 2024-03-23 VITALS — BP 120/82 | HR 86 | Temp 98.3°F | Ht 70.0 in | Wt 208.0 lb

## 2024-03-23 DIAGNOSIS — R1032 Left lower quadrant pain: Secondary | ICD-10-CM

## 2024-03-23 NOTE — Progress Notes (Signed)
 "   Date:  03/23/2024   Name:  QUINCEY QUESINBERRY   DOB:  Feb 16, 1946   MRN:  [id number]   Chief Complaint: Hernia (X2-3 months,Lower left groin area, gradually increasing, dull pain )  HPI  Stuart presents today for evaluation of a vague intermittent left lower quadrant discomfort for about 2 months now, wants to make sure it is not a hernia, feels it most with lifting or turning.  He says this is less of a pain and more of an annoyance.  Has never had a hernia before.  No change to bowel movements, no blood in stool, completed colorectal cancer screening.  Medication list has been reviewed and updated.  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   Orchalgia 11/20/2013   Hyperlipidemia 11/20/2013   Hydrocele 11/20/2013   Glossitis 11/20/2013   Chronic kidney disease (CKD) stage G3a/A2, moderately decreased glomerular filtration rate (GFR) between 45-59 mL/min/1.73 square meter and albuminuria creatinine ratio between 30-299 mg/g (HCC) 11/20/2013   Gout 11/05/2013   Encysted hydrocele 09/22/2012   Benign prostatic hyperplasia with lower urinary tract symptoms 09/22/2012    Allergies[2]  Immunization History  Administered Date(s) Administered   Fluad Quad(high Dose 65+) 01/26/2023   INFLUENZA, HIGH DOSE SEASONAL PF 11/24/2023   Influenza Inj Mdck Quad Pf 01/23/2018   PNEUMOCOCCAL CONJUGATE-20 10/25/2022   Pneumococcal Polysaccharide-23 10/17/2018   Tdap 10/26/2022   Zoster  Recombinant(Shingrix) 10/26/2022    Past Surgical History:  Procedure Laterality Date   CHOLECYSTECTOMY     COLONOSCOPY WITH PROPOFOL  N/A 05/01/2018   Procedure: COLONOSCOPY WITH PROPOFOL ;  Surgeon: Gaylyn Gladis PENNER, MD;  Location: [facility] ENDOSCOPY;  Service: Endoscopy;  Laterality: N/A;   deviated septum repair      Social History[3]  Family History  Problem Relation Age of Onset   Diabetes Mother    Kidney disease Father    Diabetes Maternal Uncle    Colon cancer Neg Hx    Esophageal cancer Neg Hx    Stomach cancer Neg Hx    Rectal cancer Neg Hx         01/30/2024    9:02 AM 09/27/2023    8:05 AM 07/15/2023    9:03 AM 05/25/2023    8:03 AM  GAD 7 : Generalized Anxiety Score  Nervous, Anxious, on Edge 0 0 0 0  Control/stop worrying 0 0 0 0  Worry too much - different things 0 0  0  Trouble relaxing 0 0  0  Restless 0 0  0  Easily annoyed or irritable 0 0  0  Afraid - awful might happen 0 0  0  Total GAD 7 Score 0 0  0  Anxiety Difficulty Not difficult at all Not difficult at all  Not difficult at all       01/30/2024    9:02 AM 09/27/2023    8:05 AM 09/07/2023    8:17 AM  Depression screen PHQ 2/9  Decreased Interest 0 0 0  Down, Depressed, Hopeless 0 0 0  PHQ - 2 Score 0  0 0  Altered sleeping   0  Tired, decreased energy   0  Change in appetite   0  Feeling bad or failure about yourself    0  Trouble concentrating   0  Moving slowly or fidgety/restless   0  Suicidal thoughts   0  PHQ-9 Score   0   Difficult doing work/chores   Not difficult at all     Data saved with a previous flowsheet row definition    BP Readings from Last 3 Encounters:  03/23/24 120/82  01/30/24 122/82  01/19/24 106/64    Wt Readings from Last 3 Encounters:  03/23/24 208 lb (94.3 kg)  01/30/24 204 lb (92.5 kg)  01/19/24 202 lb 9.6 oz (91.9 kg)    BP 120/82   Pulse 86   Temp 98.3 F (36.8 C)   Ht 5' 10 (1.778 m)   Wt 208 lb (94.3 kg)   SpO2 96%   BMI 29.84 kg/m    Physical Exam Vitals and nursing note reviewed.  Constitutional:      Appearance: Normal appearance.  Cardiovascular:     Rate and Rhythm: Normal rate.  Pulmonary:     Effort: Pulmonary effort is normal.  Abdominal:     General: There is no distension.     Palpations: Abdomen is soft.     Tenderness: There is no abdominal tenderness.     Hernia: No hernia is present. There is no hernia in the ventral area or left inguinal area.  Musculoskeletal:        General: Normal range of motion.  Skin:    General: Skin is warm and dry.  Neurological:     Mental Status: He is alert and oriented to person, place, and time.     Gait: Gait is intact.  Psychiatric:        Mood and Affect: Mood and affect normal.     Recent Labs     Component Value Date/Time   NA 138 10/11/2023 0933   K 4.9 10/11/2023 0933   CL 101 10/11/2023 0933   CO2 23 10/11/2023 0933   GLUCOSE 94 10/11/2023 0933   BUN 14 10/11/2023 0933   CREATININE 1.22 10/11/2023 0933   CALCIUM  10.2 10/11/2023 0933   PROT 6.7 05/25/2023 0859   ALBUMIN 4.4 05/25/2023 0859   AST 19 05/25/2023 0859   ALT 13 05/25/2023 0859   ALKPHOS 72 05/25/2023 0859   BILITOT 0.5 05/25/2023 0859    Lab Results  Component Value Date   WBC 5.6 05/25/2023   HGB 13.9 05/25/2023   HCT 42.7 05/25/2023   MCV 93 05/25/2023   PLT 202 05/25/2023   No results found for: HGBA1C Lab Results  Component Value Date   CHOL 128 05/25/2023   HDL 42 05/25/2023   LDLCALC 68 05/25/2023   TRIG 96 05/25/2023   CHOLHDL 3.0 05/25/2023   No results found for: TSH    Assessment and Plan:  1. Abdominal discomfort in left lower quadrant (Primary) Suspect muscular etiology.  Patient was able to localize the area of this discomfort on exam today, but it is not particularly tender and no hernias appreciated when standing or supine, with or without Valsalva.  Recommend continued monitoring for progression, but I imagine this will spontaneously  resolve.     Rolan Hoyle, PA-C, DMSc, DipACLM, Nutritionist [facility] [phone]      [1]  Current  Meds  Medication Sig   albuterol  (VENTOLIN  HFA) 108 (90 Base) MCG/ACT inhaler Inhale 2 puffs into the lungs every 4 (four) hours as needed for wheezing or shortness of breath.   allopurinol  (ZYLOPRIM ) 100 MG tablet Take 1 tablet (100 mg total) by mouth daily.   Ascorbic Acid (VITA-C PO) Take by mouth.   calcium  carbonate (OSCAL) 1500 (600 Ca) MG TABS tablet Take 600 mg of elemental calcium  by mouth 2 (two) times daily with a meal.   Cholecalciferol (VITAMIN D ) 125 MCG (5000 UT) CAPS Take 5,000 Units by mouth daily.   finasteride  (PROSCAR ) 5 MG tablet TAKE 1 TABLET (5 MG TOTAL) BY MOUTH DAILY.   rosuvastatin  (CRESTOR ) 5 MG tablet Take 1 tablet (5 mg total) by mouth daily.   tiZANidine  (ZANAFLEX ) 2 MG tablet Take 1 tablet (2 mg total) by mouth every 8 (eight) hours as needed for muscle spasms.  [2]  Allergies Allergen Reactions   Flomax  [Tamsulosin ] Other (See Comments)    Tired and lightheaded, syncope, blacked out   Colchicine Diarrhea and Nausea Only  [3]  Social History Tobacco Use   Smoking status: Never   Smokeless tobacco: Never  Vaping Use   Vaping status: Never Used  Substance Use Topics   Alcohol use: Yes    Comment: 1 glass of wine 2-3 times per month   Drug use: Never   "

## 2024-03-27 ENCOUNTER — Other Ambulatory Visit: Payer: Self-pay | Admitting: Physician Assistant

## 2024-03-27 DIAGNOSIS — M1A3720 Chronic gout due to renal impairment, left ankle and foot, without tophus (tophi): Secondary | ICD-10-CM

## 2024-03-27 NOTE — Telephone Encounter (Signed)
 Requested Prescriptions  Pending Prescriptions Disp Refills   allopurinol  (ZYLOPRIM ) 100 MG tablet [Pharmacy Med Name: ALLOPURINOL  100 MG TABLET] 90 tablet 1    Sig: TAKE 1 TABLET BY MOUTH EVERY DAY     Endocrinology:  Gout Agents - allopurinol  Failed - 03/27/2024  4:23 PM      Failed - Uric Acid in normal range and within 360 days    Uric Acid  Date Value Ref Range Status  01/25/2023 5.0 3.8 - 8.4 mg/dL Final    Comment:               Therapeutic target for gout patients: <6.0         Passed - Cr in normal range and within 360 days    Creatinine, Ser  Date Value Ref Range Status  10/11/2023 1.22 0.76 - 1.27 mg/dL Final         Passed - Valid encounter within last 12 months    Recent Outpatient Visits           4 days ago Abdominal discomfort in left lower quadrant   [place] [facility] at [facility], Toribio SQUIBB, [place]   1 month ago Annual physical exam   [facility] at [facility], Toribio SQUIBB, [place]   6 months ago Chronic kidney disease (CKD) stage G3a/A2, moderately decreased glomerular filtration rate (GFR) between 45-59 mL/min/1.73 square meter and albuminuria creatinine ratio between 30-299 mg/g [facility])   [place] [facility] at [facility], Toribio SQUIBB, [place]   8 months ago Low back strain, initial encounter   [facility], Toribio SQUIBB, [place]   10 months ago Chronic kidney disease (CKD) stage G3a/A2, moderately decreased glomerular filtration rate (GFR) between 45-59 mL/min/1.73 square meter and albuminuria creatinine ratio between 30-299 mg/g [facility])   [facility] at [facility], Toribio SQUIBB, [place]              Passed - CBC within normal limits and completed in the last 12 months    WBC  Date Value Ref Range Status  05/25/2023 5.6 3.4 - 10.8 x10E3/uL Final   RBC  Date Value Ref  Range Status  05/25/2023 4.61 4.14 - 5.80 x10E6/uL Final   Hemoglobin  Date Value Ref Range Status  05/25/2023 13.9 13.0 - 17.7 g/dL Final   Hematocrit  Date Value Ref Range Status  05/25/2023 42.7 37.5 - 51.0 % Final   MCHC  Date Value Ref Range Status  05/25/2023 32.6 31.5 - 35.7 g/dL Final   [facility]  Date Value Ref Range Status  05/25/2023 30.2 26.6 - 33.0 pg Final   MCV  Date Value Ref Range Status  05/25/2023 93 79 - 97 fL Final   No results found for: PLTCOUNTKUC, LABPLAT, POCPLA RDW  Date Value Ref Range Status  05/25/2023 12.5 11.6 - 15.4 % Final

## 2024-04-15 ENCOUNTER — Other Ambulatory Visit: Payer: Self-pay | Admitting: Physician Assistant

## 2024-04-15 DIAGNOSIS — E782 Mixed hyperlipidemia: Secondary | ICD-10-CM

## 2024-04-17 NOTE — Telephone Encounter (Signed)
 Requested Prescriptions  Pending Prescriptions Disp Refills   rosuvastatin  (CRESTOR ) 5 MG tablet [Pharmacy Med Name: ROSUVASTATIN  CALCIUM  5 MG TAB] 90 tablet 1    Sig: TAKE 1 TABLET (5 MG TOTAL) BY MOUTH DAILY.     Cardiovascular:  Antilipid - Statins 2 Failed - 04/17/2024  9:22 AM      Failed - Lipid Panel in normal range within the last 12 months    Cholesterol, Total  Date Value Ref Range Status  05/25/2023 128 100 - 199 mg/dL Final   LDL Chol Calc (NIH)  Date Value Ref Range Status  05/25/2023 68 0 - 99 mg/dL Final   HDL  Date Value Ref Range Status  05/25/2023 42 >39 mg/dL Final   Triglycerides  Date Value Ref Range Status  05/25/2023 96 0 - 149 mg/dL Final         Passed - Cr in normal range and within 360 days    Creatinine, Ser  Date Value Ref Range Status  10/11/2023 1.22 0.76 - 1.27 mg/dL Final         Passed - Patient is not pregnant      Passed - Valid encounter within last 12 months    Recent Outpatient Visits           3 weeks ago Abdominal discomfort in left lower quadrant   [place] [facility] at [facility], Toribio SQUIBB, [place]   2 months ago Annual physical exam   [facility] at [facility], Toribio SQUIBB, [place]   6 months ago Chronic kidney disease (CKD) stage G3a/A2, moderately decreased glomerular filtration rate (GFR) between 45-59 mL/min/1.73 square meter and albuminuria creatinine ratio between 30-299 mg/g [facility])   [place] [facility] at [facility], Toribio SQUIBB, [place]   9 months ago Low back strain, initial encounter   [facility] at [facility], Toribio SQUIBB, [place]   10 months ago Chronic kidney disease (CKD) stage G3a/A2, moderately decreased glomerular filtration rate (GFR) between 45-59 mL/min/1.73 square meter and albuminuria creatinine ratio between 30-299 mg/g [facility])   [facility]  at [facility], Toribio SQUIBB, [place]

## 2024-07-30 ENCOUNTER — Ambulatory Visit: Admitting: Physician Assistant

## 2024-09-12 ENCOUNTER — Ambulatory Visit

## 2025-01-30 ENCOUNTER — Encounter: Admitting: Physician Assistant
